# Patient Record
Sex: Female | Born: 1935 | Race: White | Hispanic: No | Smoking: Former smoker
Health system: Southern US, Community
[De-identification: ages and names within clinical notes are randomized; demographics above are authoritative.]

## PROBLEM LIST (undated history)

## (undated) DIAGNOSIS — R4182 Altered mental status, unspecified: Secondary | ICD-10-CM

## (undated) DIAGNOSIS — F329 Major depressive disorder, single episode, unspecified: Secondary | ICD-10-CM

## (undated) DIAGNOSIS — I509 Heart failure, unspecified: Secondary | ICD-10-CM

## (undated) DIAGNOSIS — I10 Essential (primary) hypertension: Secondary | ICD-10-CM

## (undated) DIAGNOSIS — I69921 Dysphasia following unspecified cerebrovascular disease: Secondary | ICD-10-CM

## (undated) DIAGNOSIS — F32A Depression, unspecified: Secondary | ICD-10-CM

## (undated) DIAGNOSIS — D649 Anemia, unspecified: Secondary | ICD-10-CM

## (undated) DIAGNOSIS — M1990 Unspecified osteoarthritis, unspecified site: Secondary | ICD-10-CM

## (undated) DIAGNOSIS — K219 Gastro-esophageal reflux disease without esophagitis: Secondary | ICD-10-CM

## (undated) DIAGNOSIS — I739 Peripheral vascular disease, unspecified: Secondary | ICD-10-CM

## (undated) HISTORY — DX: Anemia, unspecified: D64.9

---

## 2005-05-08 ENCOUNTER — Ambulatory Visit: Payer: Self-pay | Admitting: Internal Medicine

## 2006-01-04 ENCOUNTER — Ambulatory Visit (HOSPITAL_COMMUNITY): Admission: RE | Admit: 2006-01-04 | Discharge: 2006-01-04 | Payer: Self-pay | Admitting: Internal Medicine

## 2006-01-04 ENCOUNTER — Ambulatory Visit: Payer: Self-pay | Admitting: Internal Medicine

## 2006-01-05 ENCOUNTER — Inpatient Hospital Stay (HOSPITAL_COMMUNITY): Admission: EM | Admit: 2006-01-05 | Discharge: 2006-01-12 | Payer: Self-pay | Admitting: Emergency Medicine

## 2006-01-05 ENCOUNTER — Ambulatory Visit (HOSPITAL_COMMUNITY): Admission: RE | Admit: 2006-01-05 | Discharge: 2006-01-05 | Payer: Self-pay | Admitting: Internal Medicine

## 2009-08-27 ENCOUNTER — Inpatient Hospital Stay (HOSPITAL_COMMUNITY): Admission: EM | Admit: 2009-08-27 | Discharge: 2009-09-13 | Payer: Self-pay | Admitting: Emergency Medicine

## 2009-08-27 ENCOUNTER — Ambulatory Visit: Payer: Self-pay | Admitting: Pulmonary Disease

## 2009-09-07 ENCOUNTER — Encounter (INDEPENDENT_AMBULATORY_CARE_PROVIDER_SITE_OTHER): Payer: Self-pay | Admitting: Internal Medicine

## 2009-09-16 ENCOUNTER — Inpatient Hospital Stay (HOSPITAL_COMMUNITY): Admission: EM | Admit: 2009-09-16 | Discharge: 2009-09-18 | Payer: Self-pay | Admitting: Emergency Medicine

## 2010-01-10 ENCOUNTER — Inpatient Hospital Stay (HOSPITAL_COMMUNITY): Admission: EM | Admit: 2010-01-10 | Discharge: 2010-01-15 | Payer: Self-pay | Admitting: Emergency Medicine

## 2011-02-15 LAB — CBC
HCT: 32.9 % — ABNORMAL LOW (ref 36.0–46.0)
Hemoglobin: 10.6 g/dL — ABNORMAL LOW (ref 12.0–15.0)
Hemoglobin: 7.8 g/dL — ABNORMAL LOW (ref 12.0–15.0)
Hemoglobin: 9.4 g/dL — ABNORMAL LOW (ref 12.0–15.0)
Hemoglobin: 9.4 g/dL — ABNORMAL LOW (ref 12.0–15.0)
Hemoglobin: 9.9 g/dL — ABNORMAL LOW (ref 12.0–15.0)
MCHC: 33 g/dL (ref 30.0–36.0)
MCHC: 33.2 g/dL (ref 30.0–36.0)
MCHC: 33.5 g/dL (ref 30.0–36.0)
MCHC: 34.2 g/dL (ref 30.0–36.0)
MCV: 90.1 fL (ref 78.0–100.0)
MCV: 91 fL (ref 78.0–100.0)
MCV: 91.1 fL (ref 78.0–100.0)
MCV: 91.1 fL (ref 78.0–100.0)
MCV: 91.2 fL (ref 78.0–100.0)
Platelets: 214 10*3/uL (ref 150–400)
Platelets: 239 10*3/uL (ref 150–400)
Platelets: 275 10*3/uL (ref 150–400)
Platelets: 296 10*3/uL (ref 150–400)
RBC: 2.56 MIL/uL — ABNORMAL LOW (ref 3.87–5.11)
RBC: 3.11 MIL/uL — ABNORMAL LOW (ref 3.87–5.11)
RBC: 3.15 MIL/uL — ABNORMAL LOW (ref 3.87–5.11)
RBC: 3.23 MIL/uL — ABNORMAL LOW (ref 3.87–5.11)
RBC: 3.3 MIL/uL — ABNORMAL LOW (ref 3.87–5.11)
RDW: 15.1 % (ref 11.5–15.5)
RDW: 15.4 % (ref 11.5–15.5)
RDW: 15.5 % (ref 11.5–15.5)
RDW: 15.9 % — ABNORMAL HIGH (ref 11.5–15.5)
WBC: 6 10*3/uL (ref 4.0–10.5)
WBC: 7.6 10*3/uL (ref 4.0–10.5)
WBC: 7.8 10*3/uL (ref 4.0–10.5)

## 2011-02-15 LAB — DIFFERENTIAL
Basophils Absolute: 0.1 10*3/uL (ref 0.0–0.1)
Basophils Relative: 1 % (ref 0–1)
Eosinophils Absolute: 0.1 10*3/uL (ref 0.0–0.7)
Monocytes Absolute: 0.7 10*3/uL (ref 0.1–1.0)
Neutro Abs: 8.1 10*3/uL — ABNORMAL HIGH (ref 1.7–7.7)

## 2011-02-15 LAB — TYPE AND SCREEN
ABO/RH(D): A POS
DAT, IgG: NEGATIVE
Donor AG Type: NEGATIVE
Donor AG Type: NEGATIVE
Donor AG Type: NEGATIVE

## 2011-02-15 LAB — BASIC METABOLIC PANEL
CO2: 29 mEq/L (ref 19–32)
CO2: 34 mEq/L — ABNORMAL HIGH (ref 19–32)
Calcium: 8.4 mg/dL (ref 8.4–10.5)
Calcium: 8.4 mg/dL (ref 8.4–10.5)
Calcium: 8.7 mg/dL (ref 8.4–10.5)
Calcium: 9 mg/dL (ref 8.4–10.5)
Chloride: 101 mEq/L (ref 96–112)
Chloride: 103 mEq/L (ref 96–112)
Chloride: 98 mEq/L (ref 96–112)
Creatinine, Ser: 0.65 mg/dL (ref 0.4–1.2)
Creatinine, Ser: 0.87 mg/dL (ref 0.4–1.2)
GFR calc Af Amer: >60 mL/min (ref >60)
GFR calc Af Amer: >60 mL/min (ref >60)
GFR calc Af Amer: >60 mL/min (ref >60)
GFR calc non Af Amer: >60 mL/min (ref >60)
GFR calc non Af Amer: >60 mL/min (ref >60)
Glucose, Bld: 118 mg/dL — ABNORMAL HIGH (ref 70–99)
Glucose, Bld: 67 mg/dL — ABNORMAL LOW (ref 70–99)
Glucose, Bld: 72 mg/dL (ref 70–99)
Potassium: 3.8 mEq/L (ref 3.5–5.1)
Sodium: 140 mEq/L (ref 135–145)
Sodium: 141 mEq/L (ref 135–145)
Sodium: 142 mEq/L (ref 135–145)
Sodium: 142 mEq/L (ref 135–145)
Sodium: 142 mEq/L (ref 135–145)

## 2011-02-15 LAB — COMPREHENSIVE METABOLIC PANEL
ALT: 17 U/L (ref 0–35)
CO2: 33 mEq/L — ABNORMAL HIGH (ref 19–32)
Chloride: 98 mEq/L (ref 96–112)
GFR calc Af Amer: >60 mL/min (ref >60)
GFR calc non Af Amer: >60 mL/min (ref >60)
Glucose, Bld: 129 mg/dL — ABNORMAL HIGH (ref 70–99)
Potassium: 3.9 mEq/L (ref 3.5–5.1)
Total Protein: 5.8 g/dL — ABNORMAL LOW (ref 6.0–8.3)

## 2011-02-15 LAB — BLOOD GAS, ARTERIAL
Bicarbonate: 25.6 mEq/L — ABNORMAL HIGH (ref 20.0–24.0)
Drawn by: 10370
O2 Content: 3 L/min
O2 Saturation: 92.3 %
pCO2 arterial: 43.6 mmHg (ref 35.0–45.0)
pH, Arterial: 7.386 (ref 7.350–7.400)
pO2, Arterial: 62.3 mmHg — ABNORMAL LOW (ref 80.0–100.0)

## 2011-02-15 LAB — TSH: TSH: 2.234 u[IU]/mL (ref 0.350–4.500)

## 2011-03-02 LAB — BLOOD GAS, ARTERIAL
Bicarbonate: 21.1 mEq/L (ref 20.0–24.0)
Patient temperature: 98.2
TCO2: 20 mmol/L (ref 0–100)
pCO2 arterial: 48.5 mmHg — ABNORMAL HIGH (ref 35.0–45.0)
pH, Arterial: 7.26 — ABNORMAL LOW (ref 7.350–7.400)

## 2011-03-02 LAB — URINE MICROSCOPIC-ADD ON

## 2011-03-02 LAB — BASIC METABOLIC PANEL
BUN: 4 mg/dL — ABNORMAL LOW (ref 6–23)
BUN: 10 mg/dL (ref 6–23)
BUN: 12 mg/dL (ref 6–23)
BUN: 25 mg/dL — ABNORMAL HIGH (ref 6–23)
BUN: 26 mg/dL — ABNORMAL HIGH (ref 6–23)
BUN: 31 mg/dL — ABNORMAL HIGH (ref 6–23)
BUN: 7 mg/dL (ref 6–23)
CO2: 33 mEq/L — ABNORMAL HIGH (ref 19–32)
CO2: 26 mEq/L (ref 19–32)
CO2: 26 mEq/L (ref 19–32)
CO2: 35 mEq/L — ABNORMAL HIGH (ref 19–32)
Calcium: 8.1 mg/dL — ABNORMAL LOW (ref 8.4–10.5)
Calcium: 8.5 mg/dL (ref 8.4–10.5)
Calcium: 8.2 mg/dL — ABNORMAL LOW (ref 8.4–10.5)
Calcium: 7.3 mg/dL — ABNORMAL LOW (ref 8.4–10.5)
Calcium: 8 mg/dL — ABNORMAL LOW (ref 8.4–10.5)
Calcium: 8.1 mg/dL — ABNORMAL LOW (ref 8.4–10.5)
Calcium: 8.3 mg/dL — ABNORMAL LOW (ref 8.4–10.5)
Calcium: 8.4 mg/dL (ref 8.4–10.5)
Chloride: 97 mEq/L (ref 96–112)
Chloride: 114 mEq/L — ABNORMAL HIGH (ref 96–112)
Chloride: 97 mEq/L (ref 96–112)
Creatinine, Ser: 0.97 mg/dL (ref 0.4–1.2)
Creatinine, Ser: 0.65 mg/dL (ref 0.4–1.2)
Creatinine, Ser: 0.75 mg/dL (ref 0.4–1.2)
Creatinine, Ser: 0.92 mg/dL (ref 0.4–1.2)
Creatinine, Ser: 1.04 mg/dL (ref 0.4–1.2)
GFR calc Af Amer: >60 mL/min (ref >60)
GFR calc Af Amer: >60 mL/min (ref >60)
GFR calc Af Amer: 38 mL/min — ABNORMAL LOW (ref >60)
GFR calc Af Amer: >60 mL/min (ref >60)
GFR calc Af Amer: >60 mL/min (ref >60)
GFR calc Af Amer: >60 mL/min (ref >60)
GFR calc Af Amer: >60 mL/min (ref >60)
GFR calc non Af Amer: >60 mL/min (ref >60)
GFR calc non Af Amer: >60 mL/min (ref >60)
GFR calc non Af Amer: 39 mL/min — ABNORMAL LOW (ref >60)
GFR calc non Af Amer: 40 mL/min — ABNORMAL LOW (ref >60)
GFR calc non Af Amer: 31 mL/min — ABNORMAL LOW (ref >60)
GFR calc non Af Amer: 32 mL/min — ABNORMAL LOW (ref >60)
GFR calc non Af Amer: 39 mL/min — ABNORMAL LOW (ref >60)
GFR calc non Af Amer: 51 mL/min — ABNORMAL LOW (ref >60)
GFR calc non Af Amer: 59 mL/min — ABNORMAL LOW (ref >60)
GFR calc non Af Amer: >60 mL/min (ref >60)
GFR calc non Af Amer: >60 mL/min (ref >60)
GFR calc non Af Amer: >60 mL/min (ref >60)
Glucose, Bld: 97 mg/dL (ref 70–99)
Glucose, Bld: 146 mg/dL — ABNORMAL HIGH (ref 70–99)
Glucose, Bld: 157 mg/dL — ABNORMAL HIGH (ref 70–99)
Glucose, Bld: 171 mg/dL — ABNORMAL HIGH (ref 70–99)
Potassium: 3.1 mEq/L — ABNORMAL LOW (ref 3.5–5.1)
Potassium: 3.2 mEq/L — ABNORMAL LOW (ref 3.5–5.1)
Potassium: 4.1 mEq/L (ref 3.5–5.1)
Potassium: 2.5 mEq/L — CL (ref 3.5–5.1)
Potassium: 3.1 mEq/L — ABNORMAL LOW (ref 3.5–5.1)
Potassium: 3.4 mEq/L — ABNORMAL LOW (ref 3.5–5.1)
Potassium: 4 mEq/L (ref 3.5–5.1)
Potassium: 4.3 mEq/L (ref 3.5–5.1)
Sodium: 141 mEq/L (ref 135–145)
Sodium: 141 mEq/L (ref 135–145)
Sodium: 140 mEq/L (ref 135–145)
Sodium: 141 mEq/L (ref 135–145)
Sodium: 140 mEq/L (ref 135–145)
Sodium: 143 mEq/L (ref 135–145)
Sodium: 144 mEq/L (ref 135–145)
Sodium: 144 mEq/L (ref 135–145)
Sodium: 145 mEq/L (ref 135–145)
Sodium: 146 mEq/L — ABNORMAL HIGH (ref 135–145)

## 2011-03-02 LAB — CARDIAC PANEL(CRET KIN+CKTOT+MB+TROPI)
CK, MB: 2.6 ng/mL (ref 0.3–4.0)
CK, MB: 3.8 ng/mL (ref 0.3–4.0)
CK, MB: 4.3 ng/mL — ABNORMAL HIGH (ref 0.3–4.0)
CK, MB: 5.5 ng/mL — ABNORMAL HIGH (ref 0.3–4.0)
Relative Index: INVALID (ref 0.0–2.5)
Relative Index: INVALID (ref 0.0–2.5)
Total CK: 21 U/L (ref 7–177)
Total CK: 426 U/L — ABNORMAL HIGH (ref 7–177)
Troponin I: 0.08 ng/mL — ABNORMAL HIGH (ref 0.00–0.06)
Troponin I: 0.08 ng/mL — ABNORMAL HIGH (ref 0.00–0.06)
Troponin I: 0.08 ng/mL — ABNORMAL HIGH (ref 0.00–0.06)

## 2011-03-02 LAB — CBC
HCT: 31.6 % — ABNORMAL LOW (ref 36.0–46.0)
HCT: 33 % — ABNORMAL LOW (ref 36.0–46.0)
HCT: 34.4 % — ABNORMAL LOW (ref 36.0–46.0)
HCT: 29.2 % — ABNORMAL LOW (ref 36.0–46.0)
HCT: 30.2 % — ABNORMAL LOW (ref 36.0–46.0)
HCT: 30.3 % — ABNORMAL LOW (ref 36.0–46.0)
HCT: 30.6 % — ABNORMAL LOW (ref 36.0–46.0)
HCT: 31.1 % — ABNORMAL LOW (ref 36.0–46.0)
HCT: 31.3 % — ABNORMAL LOW (ref 36.0–46.0)
HCT: 31.4 % — ABNORMAL LOW (ref 36.0–46.0)
HCT: 32.2 % — ABNORMAL LOW (ref 36.0–46.0)
HCT: 35.5 % — ABNORMAL LOW (ref 36.0–46.0)
HCT: 36.3 % (ref 36.0–46.0)
HCT: 37.9 % (ref 36.0–46.0)
Hemoglobin: 9.8 g/dL — ABNORMAL LOW (ref 12.0–15.0)
Hemoglobin: 11.2 g/dL — ABNORMAL LOW (ref 12.0–15.0)
Hemoglobin: 10 g/dL — ABNORMAL LOW (ref 12.0–15.0)
Hemoglobin: 10 g/dL — ABNORMAL LOW (ref 12.0–15.0)
Hemoglobin: 11.4 g/dL — ABNORMAL LOW (ref 12.0–15.0)
Hemoglobin: 11.6 g/dL — ABNORMAL LOW (ref 12.0–15.0)
Hemoglobin: 11.9 g/dL — ABNORMAL LOW (ref 12.0–15.0)
Hemoglobin: 12.4 g/dL (ref 12.0–15.0)
Hemoglobin: 9.4 g/dL — ABNORMAL LOW (ref 12.0–15.0)
Hemoglobin: 9.9 g/dL — ABNORMAL LOW (ref 12.0–15.0)
Hemoglobin: 9.9 g/dL — ABNORMAL LOW (ref 12.0–15.0)
MCHC: 32.5 g/dL (ref 30.0–36.0)
MCHC: 31.7 g/dL (ref 30.0–36.0)
MCHC: 31.8 g/dL (ref 30.0–36.0)
MCHC: 32.3 g/dL (ref 30.0–36.0)
MCHC: 32.5 g/dL (ref 30.0–36.0)
MCHC: 32.6 g/dL (ref 30.0–36.0)
MCHC: 32.7 g/dL (ref 30.0–36.0)
MCHC: 32.8 g/dL (ref 30.0–36.0)
MCHC: 33 g/dL (ref 30.0–36.0)
MCHC: 33.2 g/dL (ref 30.0–36.0)
MCV: 89.8 fL (ref 78.0–100.0)
MCV: 89.9 fL (ref 78.0–100.0)
MCV: 90.5 fL (ref 78.0–100.0)
MCV: 90.9 fL (ref 78.0–100.0)
MCV: 90.9 fL (ref 78.0–100.0)
MCV: 91.1 fL (ref 78.0–100.0)
MCV: 91.4 fL (ref 78.0–100.0)
MCV: 91.7 fL (ref 78.0–100.0)
MCV: 92.5 fL (ref 78.0–100.0)
Platelets: 177 10*3/uL (ref 150–400)
Platelets: 247 10*3/uL (ref 150–400)
Platelets: 321 10*3/uL (ref 150–400)
Platelets: 331 10*3/uL (ref 150–400)
Platelets: 117 K/uL — ABNORMAL LOW (ref 150–400)
Platelets: 125 10*3/uL — ABNORMAL LOW (ref 150–400)
Platelets: 134 K/uL — ABNORMAL LOW (ref 150–400)
Platelets: 163 10*3/uL (ref 150–400)
Platelets: 178 10*3/uL (ref 150–400)
Platelets: 192 K/uL (ref 150–400)
Platelets: 247 10*3/uL (ref 150–400)
Platelets: 290 10*3/uL (ref 150–400)
Platelets: 320 10*3/uL (ref 150–400)
Platelets: 324 10*3/uL (ref 150–400)
RBC: 3.36 MIL/uL — ABNORMAL LOW (ref 3.87–5.11)
RBC: 3.58 MIL/uL — ABNORMAL LOW (ref 3.87–5.11)
RBC: 3.82 MIL/uL — ABNORMAL LOW (ref 3.87–5.11)
RBC: 3.3 MIL/uL — ABNORMAL LOW (ref 3.87–5.11)
RBC: 3.34 MIL/uL — ABNORMAL LOW (ref 3.87–5.11)
RBC: 3.34 MIL/uL — ABNORMAL LOW (ref 3.87–5.11)
RBC: 3.4 MIL/uL — ABNORMAL LOW (ref 3.87–5.11)
RBC: 3.76 MIL/uL — ABNORMAL LOW (ref 3.87–5.11)
RBC: 3.97 MIL/uL (ref 3.87–5.11)
RBC: 4.02 MIL/uL (ref 3.87–5.11)
RBC: 4.16 MIL/uL (ref 3.87–5.11)
RDW: 14.6 % (ref 11.5–15.5)
RDW: 15.2 % (ref 11.5–15.5)
RDW: 14.2 % (ref 11.5–15.5)
RDW: 14.7 % (ref 11.5–15.5)
RDW: 15 % (ref 11.5–15.5)
RDW: 15.2 % (ref 11.5–15.5)
RDW: 15.4 % (ref 11.5–15.5)
RDW: 15.5 % (ref 11.5–15.5)
RDW: 15.6 % — ABNORMAL HIGH (ref 11.5–15.5)
RDW: 15.7 % — ABNORMAL HIGH (ref 11.5–15.5)
RDW: 15.8 % — ABNORMAL HIGH (ref 11.5–15.5)
RDW: 16.4 % — ABNORMAL HIGH (ref 11.5–15.5)
WBC: 5.2 10*3/uL (ref 4.0–10.5)
WBC: 7.6 10*3/uL (ref 4.0–10.5)
WBC: 8.3 10*3/uL (ref 4.0–10.5)
WBC: 10.2 10*3/uL (ref 4.0–10.5)
WBC: 10.8 10*3/uL — ABNORMAL HIGH (ref 4.0–10.5)
WBC: 10.8 K/uL — ABNORMAL HIGH (ref 4.0–10.5)
WBC: 12.8 10*3/uL — ABNORMAL HIGH (ref 4.0–10.5)
WBC: 13.3 10*3/uL — ABNORMAL HIGH (ref 4.0–10.5)
WBC: 13.6 K/uL — ABNORMAL HIGH (ref 4.0–10.5)
WBC: 14.7 K/uL — ABNORMAL HIGH (ref 4.0–10.5)
WBC: 16.5 10*3/uL — ABNORMAL HIGH (ref 4.0–10.5)
WBC: 18.9 10*3/uL — ABNORMAL HIGH (ref 4.0–10.5)

## 2011-03-02 LAB — BASIC METABOLIC PANEL WITH GFR
BUN: 37 mg/dL — ABNORMAL HIGH (ref 6–23)
BUN: 41 mg/dL — ABNORMAL HIGH (ref 6–23)
CO2: 23 meq/L (ref 19–32)
CO2: 24 meq/L (ref 19–32)
Calcium: 6.9 mg/dL — ABNORMAL LOW (ref 8.4–10.5)
Calcium: 7.2 mg/dL — ABNORMAL LOW (ref 8.4–10.5)
Chloride: 102 meq/L (ref 96–112)
Chloride: 109 meq/L (ref 96–112)
Creatinine, Ser: 1.41 mg/dL — ABNORMAL HIGH (ref 0.4–1.2)
Creatinine, Ser: 1.67 mg/dL — ABNORMAL HIGH (ref 0.4–1.2)
GFR calc non Af Amer: 29 mL/min — ABNORMAL LOW
GFR calc non Af Amer: 36 mL/min — ABNORMAL LOW
Glucose, Bld: 128 mg/dL — ABNORMAL HIGH (ref 70–99)
Glucose, Bld: 143 mg/dL — ABNORMAL HIGH (ref 70–99)
Potassium: 4.4 meq/L (ref 3.5–5.1)
Potassium: 4.5 meq/L (ref 3.5–5.1)
Sodium: 132 meq/L — ABNORMAL LOW (ref 135–145)
Sodium: 136 meq/L (ref 135–145)

## 2011-03-02 LAB — COMPREHENSIVE METABOLIC PANEL
ALT: 12 U/L (ref 0–35)
ALT: 16 U/L (ref 0–35)
AST: 21 U/L (ref 0–37)
AST: 11 U/L (ref 0–37)
AST: 23 U/L (ref 0–37)
Albumin: 2.6 g/dL — ABNORMAL LOW (ref 3.5–5.2)
Albumin: 2.4 g/dL — ABNORMAL LOW (ref 3.5–5.2)
Alkaline Phosphatase: 55 U/L (ref 39–117)
Alkaline Phosphatase: 63 U/L (ref 39–117)
CO2: 31 mEq/L (ref 19–32)
Calcium: 8.3 mg/dL — ABNORMAL LOW (ref 8.4–10.5)
Calcium: 8.8 mg/dL (ref 8.4–10.5)
Chloride: 101 mEq/L (ref 96–112)
Chloride: 117 mEq/L — ABNORMAL HIGH (ref 96–112)
Creatinine, Ser: 0.79 mg/dL (ref 0.4–1.2)
Creatinine, Ser: 1.45 mg/dL — ABNORMAL HIGH (ref 0.4–1.2)
GFR calc Af Amer: >60 mL/min (ref >60)
GFR calc Af Amer: 42 mL/min — ABNORMAL LOW (ref >60)
GFR calc Af Amer: >60 mL/min (ref >60)
GFR calc non Af Amer: >60 mL/min (ref >60)
Glucose, Bld: 117 mg/dL — ABNORMAL HIGH (ref 70–99)
Potassium: 3.2 mEq/L — ABNORMAL LOW (ref 3.5–5.1)
Potassium: 4.1 mEq/L (ref 3.5–5.1)
Sodium: 140 mEq/L (ref 135–145)
Sodium: 145 mEq/L (ref 135–145)
Total Bilirubin: 0.7 mg/dL (ref 0.3–1.2)
Total Bilirubin: 1.3 mg/dL — ABNORMAL HIGH (ref 0.3–1.2)
Total Protein: 5 g/dL — ABNORMAL LOW (ref 6.0–8.3)
Total Protein: 5.3 g/dL — ABNORMAL LOW (ref 6.0–8.3)

## 2011-03-02 LAB — DIFFERENTIAL
Basophils Absolute: 0.1 10*3/uL (ref 0.0–0.1)
Basophils Absolute: 0.1 K/uL (ref 0.0–0.1)
Basophils Relative: 0 % (ref 0–1)
Basophils Relative: 0 % (ref 0–1)
Basophils Relative: 1 % (ref 0–1)
Basophils Relative: 1 % (ref 0–1)
Eosinophils Absolute: 0.2 10*3/uL (ref 0.0–0.7)
Eosinophils Absolute: 0 K/uL (ref 0.0–0.7)
Eosinophils Absolute: 0.1 10*3/uL (ref 0.0–0.7)
Eosinophils Absolute: 0.1 10*3/uL (ref 0.0–0.7)
Eosinophils Relative: 2 % (ref 0–5)
Eosinophils Relative: 3 % (ref 0–5)
Eosinophils Relative: 1 % (ref 0–5)
Eosinophils Relative: 1 % (ref 0–5)
Eosinophils Relative: 0 % (ref 0–5)
Eosinophils Relative: 0 % (ref 0–5)
Eosinophils Relative: 1 % (ref 0–5)
Eosinophils Relative: 1 % (ref 0–5)
Lymphocytes Relative: 12 % (ref 12–46)
Lymphocytes Relative: 17 % (ref 12–46)
Lymphocytes Relative: 10 % — ABNORMAL LOW (ref 12–46)
Lymphocytes Relative: 10 % — ABNORMAL LOW (ref 12–46)
Lymphocytes Relative: 7 % — ABNORMAL LOW (ref 12–46)
Lymphocytes Relative: 4 % — ABNORMAL LOW (ref 12–46)
Lymphocytes Relative: 7 % — ABNORMAL LOW (ref 12–46)
Lymphs Abs: 0.6 10*3/uL — ABNORMAL LOW (ref 0.7–4.0)
Lymphs Abs: 1.4 10*3/uL (ref 0.7–4.0)
Lymphs Abs: 1 10*3/uL (ref 0.7–4.0)
Lymphs Abs: 0.5 K/uL — ABNORMAL LOW (ref 0.7–4.0)
Lymphs Abs: 0.6 10*3/uL — ABNORMAL LOW (ref 0.7–4.0)
Lymphs Abs: 0.8 10*3/uL (ref 0.7–4.0)
Monocytes Absolute: 0.9 10*3/uL (ref 0.1–1.0)
Monocytes Absolute: 1.2 10*3/uL — ABNORMAL HIGH (ref 0.1–1.0)
Monocytes Absolute: 0.5 K/uL (ref 0.1–1.0)
Monocytes Absolute: 0.7 10*3/uL (ref 0.1–1.0)
Monocytes Absolute: 1 10*3/uL (ref 0.1–1.0)
Monocytes Relative: 11 % (ref 3–12)
Monocytes Relative: 4 % (ref 3–12)
Monocytes Relative: 9 % (ref 3–12)
Neutro Abs: 3.9 10*3/uL (ref 1.7–7.7)
Neutro Abs: 7.9 10*3/uL — ABNORMAL HIGH (ref 1.7–7.7)
Neutro Abs: 8.6 10*3/uL — ABNORMAL HIGH (ref 1.7–7.7)
Neutro Abs: 9.4 10*3/uL — ABNORMAL HIGH (ref 1.7–7.7)
Neutro Abs: 13.5 K/uL — ABNORMAL HIGH (ref 1.7–7.7)
Neutrophils Relative %: 80 % — ABNORMAL HIGH (ref 43–77)
Neutrophils Relative %: 92 % — ABNORMAL HIGH (ref 43–77)

## 2011-03-02 LAB — URINE CULTURE
Colony Count: NO GROWTH
Colony Count: >=100000
Culture: NO GROWTH
Culture: NO GROWTH

## 2011-03-02 LAB — T4: T4, Total: 4.8 ug/dL — ABNORMAL LOW (ref 5.0–12.5)

## 2011-03-02 LAB — TSH: TSH: 4.096 u[IU]/mL (ref 0.350–4.500)

## 2011-03-02 LAB — COMPREHENSIVE METABOLIC PANEL WITH GFR
ALT: 24 U/L (ref 0–35)
AST: 37 U/L (ref 0–37)
Albumin: 2.9 g/dL — ABNORMAL LOW (ref 3.5–5.2)
Alkaline Phosphatase: 89 U/L (ref 39–117)
BUN: 32 mg/dL — ABNORMAL HIGH (ref 6–23)
CO2: 31 meq/L (ref 19–32)
Calcium: 8.8 mg/dL (ref 8.4–10.5)
Chloride: 97 meq/L (ref 96–112)
Creatinine, Ser: 1.31 mg/dL — ABNORMAL HIGH (ref 0.4–1.2)
GFR calc non Af Amer: 39 mL/min — ABNORMAL LOW
Glucose, Bld: 113 mg/dL — ABNORMAL HIGH (ref 70–99)
Potassium: 3.7 meq/L (ref 3.5–5.1)
Sodium: 138 meq/L (ref 135–145)
Total Bilirubin: 0.8 mg/dL (ref 0.3–1.2)
Total Protein: 6.4 g/dL (ref 6.0–8.3)

## 2011-03-02 LAB — CULTURE, BLOOD (ROUTINE X 2)
Culture: NO GROWTH
Culture: NO GROWTH

## 2011-03-02 LAB — URINALYSIS, ROUTINE W REFLEX MICROSCOPIC
Glucose, UA: NEGATIVE mg/dL
Glucose, UA: NEGATIVE mg/dL
Hgb urine dipstick: NEGATIVE
Ketones, ur: NEGATIVE mg/dL
Ketones, ur: NEGATIVE mg/dL
Nitrite: NEGATIVE
Nitrite: NEGATIVE
Nitrite: NEGATIVE
Nitrite: NEGATIVE
Protein, ur: NEGATIVE mg/dL
Protein, ur: 30 mg/dL — AB
Specific Gravity, Urine: 1.028 (ref 1.005–1.030)
Specific Gravity, Urine: 1.009 (ref 1.005–1.030)
Specific Gravity, Urine: 1.024 (ref 1.005–1.030)
Urobilinogen, UA: 0.2 mg/dL (ref 0.0–1.0)
Urobilinogen, UA: 0.2 mg/dL (ref 0.0–1.0)
Urobilinogen, UA: 0.2 mg/dL (ref 0.0–1.0)
Urobilinogen, UA: 0.2 mg/dL (ref 0.0–1.0)
pH: 6 (ref 5.0–8.0)
pH: 5 (ref 5.0–8.0)
pH: 5 (ref 5.0–8.0)

## 2011-03-02 LAB — POCT CARDIAC MARKERS
Troponin i, poc: <0.05 ng/mL (ref 0.00–0.09)
Troponin i, poc: 0.09 ng/mL (ref 0.00–0.09)

## 2011-03-02 LAB — CK TOTAL AND CKMB (NOT AT ARMC)
CK, MB: 3.4 ng/mL (ref 0.3–4.0)
Relative Index: INVALID (ref 0.0–2.5)
Relative Index: 2.7 — ABNORMAL HIGH (ref 0.0–2.5)
Total CK: 17 U/L (ref 7–177)
Total CK: 126 U/L (ref 7–177)

## 2011-03-02 LAB — TYPE AND SCREEN
ABO/RH(D): A POS
Antibody Screen: POSITIVE
PT AG Type: NEGATIVE

## 2011-03-02 LAB — BRAIN NATRIURETIC PEPTIDE
Pro B Natriuretic peptide (BNP): 2380 pg/mL — ABNORMAL HIGH (ref 0.0–100.0)
Pro B Natriuretic peptide (BNP): 298 pg/mL — ABNORMAL HIGH (ref 0.0–100.0)
Pro B Natriuretic peptide (BNP): 2270 pg/mL — ABNORMAL HIGH (ref 0.0–100.0)
Pro B Natriuretic peptide (BNP): 476 pg/mL — ABNORMAL HIGH (ref 0.0–100.0)

## 2011-03-02 LAB — TROPONIN I

## 2011-03-02 LAB — POCT I-STAT 3, ART BLOOD GAS (G3+)
Bicarbonate: 33.7 mEq/L — ABNORMAL HIGH (ref 20.0–24.0)
TCO2: 35 mmol/L (ref 0–100)
pH, Arterial: 7.369 (ref 7.350–7.400)
pO2, Arterial: 108 mmHg — ABNORMAL HIGH (ref 80.0–100.0)

## 2011-03-02 LAB — HEMOGLOBIN A1C: Mean Plasma Glucose: 117 mg/dL

## 2011-03-02 LAB — PHOSPHORUS: Phosphorus: 2.2 mg/dL — ABNORMAL LOW (ref 2.3–4.6)

## 2011-03-02 LAB — T4, FREE: Free T4: 1.16 ng/dL (ref 0.80–1.80)

## 2011-03-02 LAB — T3 UPTAKE: T3 Uptake Ratio: 42.2 % — ABNORMAL HIGH (ref 22.5–37.0)

## 2011-04-14 NOTE — Discharge Summary (Signed)
NAMECORNELLA, EMMER                ACCOUNT NO.:  [id number]   MEDICAL RECORD NO.:  [id number]          PATIENT TYPE:  INP   LOCATION:  5504                         FACILITY:  [facility]   PHYSICIAN:  Wilmon Arms. Corliss Skains, M.D. DATE OF BIRTH:  [birth year]   DATE OF ADMISSION:  01/05/2006  DATE OF DISCHARGE:                                 DISCHARGE SUMMARY   ADMITTING PHYSICIAN:  Dr. Lindie Spruce   GASTROENTEROLOGIST:  Dr. Leone Payor with [facility].   CHIEF COMPLAINT AND REASON FOR ADMISSION:  Ms. Elsbernd is a 77 year old female  patient previously from [place], has been in [place] for several years.  She has a documented history of significant diverticular disease and  recurrent small-bowel obstruction. She was seen by gastroenterology earlier  in the week due to issues related to nausea, vomiting, abdominal pain, and  diarrhea. These have been problems for years. Dr. Leone Payor ordered a CT scan  of the abdomen for completeness of evaluation and this demonstrated a  significant small-bowel obstruction with an ileocecal intussusception. In  discussing with the patient any other symptoms, she tells me that she has to  force any defecation and at times after eating she has postprandial  diarrhea. While the patient was in the ER she was having active nausea and  vomiting but was refusing an NG tube despite multiple attempts and  discussions with the patient about her condition. She apparently had no  insight into the seriousness of her condition and at time of evaluation in  the ER was refusing any surgical intervention as well. In the ER she was  afebrile and her vital signs were stable. Her abdomen was quite distended  and tympanitic. She had roaring rushing bowel sounds in the upper abdomen,  none to diminished in the lower abdomen. Amylase was 36 and lipase 13,  potassium 4.3, BUN 13, creatinine 0.5, glucose 96. White count 10,900;  neutrophils 74%; hemoglobin 13.6, platelets 416,000.   The patient was  admitted with the following diagnoses:  1.  Small-bowel obstruction secondary to ileocecal intussusception.  2.  Myelosuppression, on B12 repletion.  3.  Known small-bowel diverticula with recurrent small-bowel obstructions.  4.  Abdominal pain and distention due to small-bowel obstruction.  5.  Osteoarthritis.  6.  Questionable dementia.   HOSPITAL COURSE:  The patient was admitted to the general floor. Eventually,  she consented to an NG tube. This was placed without difficulty. Her abdomen  was decompressed successfully with the NG tube. She did have a tiny amount  of stool within the first 24 hours. She was eating a large amount of ice  chips. The patient underwent a decompressive barium enema x-ray in radiology  on January 07, 2006. Dr. Ezzard Standing did discuss with the radiologist and  initially it was felt that the decompression procedure was successful, but  the x-ray was later reviewed and the radiologist doubted that he was able to  reduce the intussusception. Physicians remained in contact with the  patient's sister, who was the only living family member. The patient  continued to defer surgery despite urgings from physicians and her  sister.  By January 09, 2006, the patient's x-ray showed gas and a nondilated large  and small bowel and no obstruction or free air with a mild ileus. It was  decided at that point that we would attempt clear liquids on the patient to  see how she tolerated. Her white count had previously decreased to 5400. She  had been empirically placed on Unasyn at admission.   The next few days, the patient continued to tolerate a clear liquid diet,  then a full liquid diet, with subtle increases in abdominal distention. By  January 11, 2006, the patient was started on a low residue chopped meats  diet. She was continuing to have at least one bowel movement a day. MiraLax  had been added to the regimen as well. By date of discharge her abdomen was  distended  but not as distended as before admission. She had bowel sounds  present, she was nontender. She was continuing to have at least one bowel  movement a day. She was not having any nausea or vomiting. Consideration is  for discharge today if remains without nausea and vomiting or abdominal pain  later this afternoon.   Given the fact that the patient seemed to have lack of insight into the  seriousness of her condition, a psychiatry consult was obtained on January 10, 2006. Dr. Jeanie Sewer saw the patient. In his note he reports that the  patient has a longstanding history of anxiety causing ambivalence,  procrastination in decisions, as well as painful situations and fear  increasing this experience. This keeps the patient from making a realistic  appraisal and a consistent choice in care. The patient does not have the  capacity for informed consent. Recommendations from psychiatry, in addition  to the above, were to start the patient on Lexapro. In review of the  patient's medications prior to admission which had been on hold due to the  small-bowel obstruction, she was on Lexapro 10 mg p.o. daily and this has  been restarted.   FINAL DISCHARGE DIAGNOSES:  1.  Small-bowel obstruction, resolved at present. The patient tolerating      diet and having bowel movements.  2.  Ileocecal intussusception, stable.  3.  Generalized anxiety disorder, deemed clinically incompetent for decision      making per Dr. Jeanie Sewer January 10, 2006.  4.  Multiple diverticula found on CT scan, several very large.   DISCHARGE MEDICATIONS:  1.  MiraLax 17 g in 8 ounces of juice or water at bedtime.  2.  Docusate sodium 300 mg daily.  3.  Stop Lasix for now.  4.  Prilosec 20 mg daily.  5.  Stop Neurontin for now.  6.  Do not use Imodium or any other antidiarrheals in this patient since she      has a history of ileocecal intussusception. 7.  Stop K-Dur since Lasix is discontinued.  8.  Hold atenolol for  now; systolic blood pressure is low.  9.  B12 injections 1000 mcg weekly as before.  10. Lexapro 10 mg daily.   DIET:  Low residue with chopped meats. Avoid seeds or other large, difficult-  to-digest-type food products which may cause acute bowel obstruction.   ACTIVITY:  As previous.   WOUND CARE:  Not applicable.   OTHER ISSUES:  I did speak at length with the patient's sister, Duwaine Maxin, today on the day of discharge, telephone number [phone]. Explained  to her the findings from the psychiatrist and  she is at this time in the  process of pursuing her goals from a legal standpoint to help make decisions  for her sister, given the fact that she has been deemed clinically  incompetent.   FOLLOW-UP:  1.  The patient will return to [facility] as previously, with      notations regarding medications and treatments as above.  2.  She is to follow up with Dr. Lindie Spruce p.r.n. if she opts to electively      proceed with surgery, or they can notify [facility] if the      patient comes in with an emergent bowel obstruction requiring emergent      surgery.      Allison L. Rondel Jumbo. Tsuei, M.D.  Electronically Signed    ALE/MEDQ  D:  01/12/2006  T:  01/12/2006  Job:  [id number]   cc:   Cherylynn Ridges, M.D.  [address]  [place]  [place] [address]   Iva Boop, M.D. [facility]  [address] [place], [place] [address]

## 2011-04-14 NOTE — H&P (Signed)
Carrie [name], [name]:  [id number]   MEDICAL RECORD NO.:  [id number]          PATIENT TYPE:  INP   LOCATION:  1824                         FACILITY:  [facility]   PHYSICIAN:  Cherylynn Ridges, M.D.    DATE OF BIRTH:  08-31-1925   DATE OF ADMISSION:  01/05/2006  DATE OF DISCHARGE:                                HISTORY & PHYSICAL   GASTROENTEROLOGIST:  Dr. Leone Payor with [facility].   SURGEON AND ADMITTING PHYSICIAN:  Dr. Lindie Spruce with [facility].   CHIEF COMPLAINT:  Small-bowel obstruction and abdominal pain.   HISTORY OF PRESENT ILLNESS:  Carrie [name] is a 77 year old female patient  recently moved from [place] in the past few years. She has a documented  history of diverticular disease of the small bowel, apparent recurrent small-  bowel obstructions, as well as myelodysplasia. She was self-referred to  gastroenterology due to nausea and vomiting, abdominal pain, and diarrhea.  In talking with the patient she states she has had these type of symptoms  for years. They appear to have gotten worse over the past few months with  increasing abdominal distention and discomfort. She apparently has been able  to eat most of the time without immediate emesis, although she does report  intermittent bouts of nausea and vomiting. She also relates issues related  to postprandial diarrhea. She was evaluated by Dr. Leone Payor with GI on  January 24, 2006. Had a chest x-ray done that showed small-bowel  obstruction. She was sent for CT of the abdomen that demonstrated  significant small-bowel obstruction with an ileocecal intussusception. In  discussing other symptomatology, the patient tells me that she has to force  any defecation. She also tells me that yesterday she ate pepper steak  without nausea and vomiting but did experience postprandial diarrhea.   REVIEW OF SYSTEMS:  As per the history of present illness. She denies any  fevers or chills, chest pain,  tachypalpitations, shortness of breath,  orthopnea. Denies blood in stools or urine. No lower extremity swelling.   PAST MEDICAL HISTORY:  1.  Depression.  2.  Myelodysplasia.  3.  B12 deficiency on monthly replete.  4.  Small-bowel diverticular disease.  5.  Osteoarthritis.   PAST SURGICAL HISTORY:  1.  Appendectomy at age 77.  2.  Vulvar cancer with partial radical vulvectomy.  3.  Prior left groin dissection.  4.  Tonsillectomy.   FAMILY MEDICAL HISTORY:  Noncontributory to this admission.   SOCIAL HISTORY:  She is a widow. She is a resident of the [facility] in [place]. She does not use tobacco or alcohol products.   ALLERGIES:  No known drug allergies.   CURRENT MEDICATIONS:  1.  Docusate sodium 300 mg daily.  2.  Lasix 20 mg daily.  3.  Prilosec 20 mg daily.  4.  Neurontin 300 mg t.i.d.  5.  K-Dur 20 mEq daily.  6.  Atenolol 25 mg daily.  7.  Reglan 5 mg a.c. and h.s.  8.  B12 injection 1000 mcg weekly.  9.  Lexapro 10 mg daily.  PHYSICAL EXAMINATION:  GENERAL:  Pleasant elderly female complaining of  significant abdominal bloating and discomfort. Verbalizes alternately that  she either wants to go home or she wants to have surgery to make her feel  better.  VITAL SIGNS:  Temperature 97.5, blood pressure 122/74, pulse 84,  respirations 16.  NEUROLOGIC:  She is alert, she wears glasses. She has obvious short-term  memory deficits. I have to repeat many of the same concepts I have spoken  with her earlier in the exam. She moving all extremities x4.  HEENT:  Head is normocephalic, sclerae not injected.  NECK:  Supple, no adenopathy.  CHEST:  Bilateral lung sounds are clear to auscultation. Respiratory effort  is nonlabored.  ABDOMEN:  Quite distended and tympanitic. She has roaring and rushing bowel  sounds in the upper abdomen, none to diminished bowel sounds in the lower  abdomen. Due to the size of the abdomen, unable to appreciate by percussion   any hepatosplenomegaly. No abdominal wall hernias are noted.  GENITOURINARY:  The patient wears a diaper and is incontinent of urine.  EXTREMITIES:  Thin without any edema. Pulses are palpable.   LABORATORY:  Lipase 13, amylase 36. Sodium 143, potassium 4.3, CO2 29, BUN  13, creatinine 0.5, glucose 96, LFTs are normal. White count is mildly  elevated at 10,900 with a normal neutrophil count of 74%. Hemoglobin 13.6.  Platelets are 416,000.   DIAGNOSTICS:  CT of the abdomen and pelvis shows a distal small-bowel  obstruction secondary to ileocecal intussusception, no free air or fluid.  Chest x-ray has already been done, shows no acute chest process with small-  bowel obstruction.   IMPRESSION:  1.  Small-bowel obstruction secondary to ileocecal intussusception.  2.  Myelosuppression with known B12 deficiency.  3.  Known small-bowel diverticula with apparent recurrent small-bowel      obstruction.  4.  Abdominal pain and distention secondary to small-bowel obstruction.  5.  Osteoarthritis.  6.  Suspected dementia.   PLAN:  1.  N.p.o. for now. IV fluid hydration. NG tube decompression. I suspect      that we will not be able to get the NG tube inserted in this lady until      she goes to surgery and anesthesia assists [place] with this.  2.  Probable operative intervention. Dr. Lindie Spruce needs to discuss with the      patient. Obviously the patient's condition warrants surgical      intervention, but due to the patient's mental status Dr. Lindie Spruce needs to      discuss with her first. Again, she verbalizes alternately that she      either wants to stay and get her problem fixed or she wants to go home.  3.  We will go ahead and start her on empiric Unasyn IV q.6h.  4.  Low-dose IV pain medications due to advanced age. Zofran for nausea due      to advanced age.  5.  Labs were drawn yesterday as noted. Will add a PT/PTT. She probably     needs an EKG as well preoperatively.      Allison L.  Rennis Harding, N.P.      Cherylynn Ridges, M.D.  Electronically Signed    ALE/MEDQ  D:  01/05/2006  T:  01/05/2006  Job:  [id number]   cc:   Iva Boop, M.D. [facility]  [address] [place], [place] [address]

## 2011-05-22 ENCOUNTER — Other Ambulatory Visit: Payer: Self-pay | Admitting: Internal Medicine

## 2011-05-22 DIAGNOSIS — Z1231 Encounter for screening mammogram for malignant neoplasm of breast: Secondary | ICD-10-CM

## 2011-06-08 ENCOUNTER — Ambulatory Visit (HOSPITAL_COMMUNITY): Payer: Self-pay

## 2011-07-06 ENCOUNTER — Ambulatory Visit (HOSPITAL_COMMUNITY): Payer: Self-pay

## 2013-02-02 ENCOUNTER — Encounter (HOSPITAL_COMMUNITY): Payer: Self-pay | Admitting: Emergency Medicine

## 2013-02-02 ENCOUNTER — Inpatient Hospital Stay (HOSPITAL_COMMUNITY)
Admission: EM | Admit: 2013-02-02 | Discharge: 2013-02-11 | DRG: 291 | Disposition: A | Discharge status: Hospice | Payer: Medicare HMO | Attending: Internal Medicine | Admitting: Internal Medicine

## 2013-02-02 ENCOUNTER — Emergency Department (HOSPITAL_COMMUNITY): Payer: Medicare HMO

## 2013-02-02 ENCOUNTER — Other Ambulatory Visit: Payer: Self-pay

## 2013-02-02 DIAGNOSIS — D689 Coagulation defect, unspecified: Secondary | ICD-10-CM | POA: Diagnosis present

## 2013-02-02 DIAGNOSIS — E875 Hyperkalemia: Secondary | ICD-10-CM | POA: Diagnosis present

## 2013-02-02 DIAGNOSIS — Z66 Do not resuscitate: Secondary | ICD-10-CM | POA: Diagnosis present

## 2013-02-02 DIAGNOSIS — F0390 Unspecified dementia without behavioral disturbance: Secondary | ICD-10-CM | POA: Diagnosis present

## 2013-02-02 DIAGNOSIS — K5909 Other constipation: Secondary | ICD-10-CM | POA: Diagnosis present

## 2013-02-02 DIAGNOSIS — D72829 Elevated white blood cell count, unspecified: Secondary | ICD-10-CM | POA: Diagnosis present

## 2013-02-02 DIAGNOSIS — K761 Chronic passive congestion of liver: Secondary | ICD-10-CM | POA: Diagnosis present

## 2013-02-02 DIAGNOSIS — I509 Heart failure, unspecified: Secondary | ICD-10-CM | POA: Diagnosis present

## 2013-02-02 DIAGNOSIS — I5031 Acute diastolic (congestive) heart failure: Secondary | ICD-10-CM

## 2013-02-02 DIAGNOSIS — D649 Anemia, unspecified: Secondary | ICD-10-CM

## 2013-02-02 DIAGNOSIS — R188 Other ascites: Secondary | ICD-10-CM | POA: Diagnosis present

## 2013-02-02 DIAGNOSIS — B961 Klebsiella pneumoniae [K. pneumoniae] as the cause of diseases classified elsewhere: Secondary | ICD-10-CM | POA: Diagnosis present

## 2013-02-02 DIAGNOSIS — R7402 Elevation of levels of lactic acid dehydrogenase (LDH): Secondary | ICD-10-CM | POA: Diagnosis present

## 2013-02-02 DIAGNOSIS — R52 Pain, unspecified: Secondary | ICD-10-CM

## 2013-02-02 DIAGNOSIS — R1314 Dysphagia, pharyngoesophageal phase: Secondary | ICD-10-CM

## 2013-02-02 DIAGNOSIS — N179 Acute kidney failure, unspecified: Secondary | ICD-10-CM | POA: Diagnosis present

## 2013-02-02 DIAGNOSIS — N390 Urinary tract infection, site not specified: Secondary | ICD-10-CM | POA: Diagnosis present

## 2013-02-02 DIAGNOSIS — R627 Adult failure to thrive: Secondary | ICD-10-CM | POA: Diagnosis present

## 2013-02-02 DIAGNOSIS — I5033 Acute on chronic diastolic (congestive) heart failure: Principal | ICD-10-CM | POA: Diagnosis present

## 2013-02-02 DIAGNOSIS — F411 Generalized anxiety disorder: Secondary | ICD-10-CM | POA: Diagnosis present

## 2013-02-02 DIAGNOSIS — K5900 Constipation, unspecified: Secondary | ICD-10-CM | POA: Diagnosis present

## 2013-02-02 DIAGNOSIS — Z681 Body mass index (BMI) 19 or less, adult: Secondary | ICD-10-CM

## 2013-02-02 DIAGNOSIS — J9601 Acute respiratory failure with hypoxia: Secondary | ICD-10-CM | POA: Diagnosis present

## 2013-02-02 DIAGNOSIS — R7401 Elevation of levels of liver transaminase levels: Secondary | ICD-10-CM | POA: Diagnosis present

## 2013-02-02 DIAGNOSIS — E876 Hypokalemia: Secondary | ICD-10-CM | POA: Diagnosis present

## 2013-02-02 DIAGNOSIS — J9600 Acute respiratory failure, unspecified whether with hypoxia or hypercapnia: Secondary | ICD-10-CM | POA: Diagnosis present

## 2013-02-02 DIAGNOSIS — R531 Weakness: Secondary | ICD-10-CM

## 2013-02-02 DIAGNOSIS — R64 Cachexia: Secondary | ICD-10-CM | POA: Diagnosis present

## 2013-02-02 HISTORY — DX: Major depressive disorder, single episode, unspecified: F32.9

## 2013-02-02 HISTORY — DX: Peripheral vascular disease, unspecified: I73.9

## 2013-02-02 HISTORY — DX: Heart failure, unspecified: I50.9

## 2013-02-02 HISTORY — DX: Gastro-esophageal reflux disease without esophagitis: K21.9

## 2013-02-02 HISTORY — DX: Altered mental status, unspecified: R41.82

## 2013-02-02 HISTORY — DX: Anemia, unspecified: D64.9

## 2013-02-02 HISTORY — DX: Dysphasia following unspecified cerebrovascular disease: I69.921

## 2013-02-02 HISTORY — DX: Essential (primary) hypertension: I10

## 2013-02-02 HISTORY — DX: Unspecified osteoarthritis, unspecified site: M19.90

## 2013-02-02 HISTORY — DX: Depression, unspecified: F32.A

## 2013-02-02 LAB — URINE MICROSCOPIC-ADD ON

## 2013-02-02 LAB — URINALYSIS, ROUTINE W REFLEX MICROSCOPIC
Glucose, UA: NEGATIVE mg/dL
Glucose, UA: NEGATIVE mg/dL
Nitrite: NEGATIVE
Protein, ur: NEGATIVE mg/dL
Specific Gravity, Urine: 1.022 (ref 1.005–1.030)
Specific Gravity, Urine: 1.02 (ref 1.005–1.030)
pH: 5 (ref 5.0–8.0)
pH: 5 (ref 5.0–8.0)

## 2013-02-02 LAB — CBC WITH DIFFERENTIAL/PLATELET
Eosinophils Absolute: 0 10*3/uL (ref 0.0–0.7)
HCT: 30.8 % — ABNORMAL LOW (ref 36.0–46.0)
Hemoglobin: 8.8 g/dL — ABNORMAL LOW (ref 12.0–15.0)
Lymphs Abs: 1.1 10*3/uL (ref 0.7–4.0)
MCH: 22.4 pg — ABNORMAL LOW (ref 26.0–34.0)
MCV: 78.6 fL (ref 78.0–100.0)
Monocytes Relative: 11 % (ref 3–12)
Neutrophils Relative %: 80 % — ABNORMAL HIGH (ref 43–77)
RBC: 3.92 MIL/uL (ref 3.87–5.11)

## 2013-02-02 LAB — TROPONIN I: Troponin I: <0.3 ng/mL (ref <0.30)

## 2013-02-02 LAB — COMPREHENSIVE METABOLIC PANEL
Alkaline Phosphatase: 99 U/L (ref 39–117)
BUN: 59 mg/dL — ABNORMAL HIGH (ref 6–23)
GFR calc Af Amer: 35 mL/min — ABNORMAL LOW (ref >90)
Glucose, Bld: 88 mg/dL (ref 70–99)
Potassium: 6.7 mEq/L (ref 3.5–5.1)
Total Bilirubin: 0.5 mg/dL (ref 0.3–1.2)
Total Protein: 5.3 g/dL — ABNORMAL LOW (ref 6.0–8.3)

## 2013-02-02 LAB — MRSA PCR SCREENING: MRSA by PCR: NEGATIVE

## 2013-02-02 LAB — PROTIME-INR: INR: 1.69 — ABNORMAL HIGH (ref 0.00–1.49)

## 2013-02-02 MED ORDER — POLYETHYL GLYCOL-PROPYL GLYCOL 0.4-0.3 % OP GEL: 1.0000 [drp] | Freq: Two times a day (BID) | OPHTHALMIC | Status: DC

## 2013-02-02 MED ORDER — ACETAMINOPHEN 325 MG PO TABS
650.0000 mg | ORAL_TABLET | Freq: Four times a day (QID) | ORAL | Status: DC | PRN
  Administered 2013-02-07 – 2013-02-10 (×4): 650 mg via ORAL
  Filled 2013-02-02 (×4): qty 2

## 2013-02-02 MED ORDER — GUAIFENESIN ER 600 MG PO TB12
600.0000 mg | ORAL_TABLET | Freq: Two times a day (BID) | ORAL | Status: DC
  Filled 2013-02-02: qty 1

## 2013-02-02 MED ORDER — PSEUDOEPHEDRINE HCL 30 MG PO TABS
30.0000 mg | ORAL_TABLET | Freq: Four times a day (QID) | ORAL | Status: DC
  Filled 2013-02-02 (×2): qty 1

## 2013-02-02 MED ORDER — INSULIN ASPART 100 UNIT/ML IV SOLN: 10.0000 [IU] | Freq: Once | INTRAVENOUS | Status: DC

## 2013-02-02 MED ORDER — SODIUM CHLORIDE 0.9 % IV SOLN
INTRAVENOUS | Status: DC
Start: 2013-02-02 — End: 2013-02-11
  Administered 2013-02-09: 12:00:00 via INTRAVENOUS

## 2013-02-02 MED ORDER — SODIUM CHLORIDE 0.9 % IV SOLN
Freq: Once | INTRAVENOUS | Status: AC
  Administered 2013-02-02: 16:00:00 via INTRAVENOUS

## 2013-02-02 MED ORDER — ACETAMINOPHEN 650 MG RE SUPP: 650.0000 mg | Freq: Four times a day (QID) | RECTAL | Status: DC | PRN

## 2013-02-02 MED ORDER — CALCIUM GLUCONATE 10 % IV SOLN
1.0000 g | Freq: Once | INTRAVENOUS | Status: AC
  Administered 2013-02-02: 1 g via INTRAVENOUS
  Filled 2013-02-02: qty 10

## 2013-02-02 MED ORDER — ENOXAPARIN SODIUM 30 MG/0.3ML ~~LOC~~ SOLN
30.0000 mg | SUBCUTANEOUS | Status: DC
  Administered 2013-02-02 – 2013-02-09 (×8): 30 mg via SUBCUTANEOUS
  Filled 2013-02-02 (×9): qty 0.3

## 2013-02-02 MED ORDER — POLYVINYL ALCOHOL 1.4 % OP SOLN
1.0000 [drp] | Freq: Two times a day (BID) | OPHTHALMIC | Status: DC
  Administered 2013-02-02 – 2013-02-11 (×18): 1 [drp] via OPHTHALMIC
  Filled 2013-02-02 (×3): qty 15

## 2013-02-02 MED ORDER — GABAPENTIN 300 MG PO CAPS
600.0000 mg | ORAL_CAPSULE | Freq: Three times a day (TID) | ORAL | Status: DC
  Administered 2013-02-02 – 2013-02-04 (×5): 600 mg via ORAL
  Filled 2013-02-02 (×7): qty 2

## 2013-02-02 MED ORDER — GUAIFENESIN ER 600 MG PO TB12
600.0000 mg | ORAL_TABLET | Freq: Two times a day (BID) | ORAL | Status: DC
  Administered 2013-02-02 – 2013-02-11 (×15): 600 mg via ORAL
  Filled 2013-02-02 (×20): qty 1

## 2013-02-02 MED ORDER — ALBUTEROL SULFATE (5 MG/ML) 0.5% IN NEBU: 2.5000 mg | INHALATION_SOLUTION | RESPIRATORY_TRACT | Status: DC | PRN

## 2013-02-02 MED ORDER — SODIUM CHLORIDE 0.9 % IJ SOLN
3.0000 mL | Freq: Two times a day (BID) | INTRAMUSCULAR | Status: DC
  Administered 2013-02-03 – 2013-02-11 (×5): 3 mL via INTRAVENOUS

## 2013-02-02 MED ORDER — ESCITALOPRAM OXALATE 10 MG PO TABS
10.0000 mg | ORAL_TABLET | Freq: Every day | ORAL | Status: DC
  Administered 2013-02-03 – 2013-02-11 (×9): 10 mg via ORAL
  Filled 2013-02-02 (×9): qty 1

## 2013-02-02 MED ORDER — ALBUTEROL SULFATE (5 MG/ML) 0.5% IN NEBU
2.5000 mg | INHALATION_SOLUTION | Freq: Four times a day (QID) | RESPIRATORY_TRACT | Status: DC
  Administered 2013-02-02: 2.5 mg via RESPIRATORY_TRACT
  Filled 2013-02-02: qty 0.5

## 2013-02-02 MED ORDER — ASPIRIN 81 MG PO CHEW
81.0000 mg | CHEWABLE_TABLET | Freq: Every day | ORAL | Status: DC
  Administered 2013-02-03 – 2013-02-11 (×9): 81 mg via ORAL
  Filled 2013-02-02 (×9): qty 1

## 2013-02-02 MED ORDER — MIRTAZAPINE 7.5 MG PO TABS
7.5000 mg | ORAL_TABLET | Freq: Every day | ORAL | Status: DC
  Administered 2013-02-02 – 2013-02-10 (×9): 7.5 mg via ORAL
  Filled 2013-02-02 (×11): qty 1

## 2013-02-02 MED ORDER — SODIUM BICARBONATE 8.4 % IV SOLN
50.0000 meq | Freq: Once | INTRAVENOUS | Status: AC
  Administered 2013-02-02: 50 meq via INTRAVENOUS
  Filled 2013-02-02: qty 50

## 2013-02-02 MED ORDER — FUROSEMIDE 10 MG/ML IJ SOLN
40.0000 mg | Freq: Once | INTRAMUSCULAR | Status: AC
  Administered 2013-02-02: 40 mg via INTRAVENOUS
  Filled 2013-02-02: qty 4

## 2013-02-02 MED ORDER — IPRATROPIUM BROMIDE 0.02 % IN SOLN
0.5000 mg | Freq: Four times a day (QID) | RESPIRATORY_TRACT | Status: DC
  Administered 2013-02-02: 0.5 mg via RESPIRATORY_TRACT
  Filled 2013-02-02: qty 2.5

## 2013-02-02 MED ORDER — ALBUTEROL SULFATE (5 MG/ML) 0.5% IN NEBU
10.0000 mg | INHALATION_SOLUTION | Freq: Once | RESPIRATORY_TRACT | Status: AC
  Administered 2013-02-02: 10 mg via RESPIRATORY_TRACT

## 2013-02-02 MED ORDER — ONDANSETRON HCL 4 MG/2ML IJ SOLN: 4.0000 mg | Freq: Three times a day (TID) | INTRAMUSCULAR | Status: AC | PRN

## 2013-02-02 MED ORDER — DEXTROSE 50 % IV SOLN
1.0000 | Freq: Once | INTRAVENOUS | Status: DC
  Filled 2013-02-02: qty 50

## 2013-02-02 MED ORDER — GABAPENTIN 600 MG PO TABS: 600.0000 mg | ORAL_TABLET | Freq: Three times a day (TID) | ORAL | Status: DC

## 2013-02-02 MED ORDER — IPRATROPIUM BROMIDE 0.02 % IN SOLN: 0.5000 mg | RESPIRATORY_TRACT | Status: DC | PRN

## 2013-02-02 MED ORDER — ONDANSETRON HCL 4 MG/2ML IJ SOLN: 4.0000 mg | Freq: Four times a day (QID) | INTRAMUSCULAR | Status: DC | PRN

## 2013-02-02 MED ORDER — CALCIUM CARBONATE-VITAMIN D 500-200 MG-UNIT PO TABS
1.0000 | ORAL_TABLET | Freq: Every day | ORAL | Status: DC
  Administered 2013-02-03 – 2013-02-10 (×8): 1 via ORAL
  Filled 2013-02-02 (×8): qty 1

## 2013-02-02 MED ORDER — SODIUM POLYSTYRENE SULFONATE 15 GM/60ML PO SUSP
30.0000 g | Freq: Once | ORAL | Status: AC
  Administered 2013-02-02: 30 g via ORAL
  Filled 2013-02-02: qty 120

## 2013-02-02 MED ORDER — SODIUM CHLORIDE 0.9 % IV SOLN: INTRAVENOUS | Status: DC

## 2013-02-02 MED ORDER — ONDANSETRON HCL 4 MG PO TABS: 4.0000 mg | ORAL_TABLET | Freq: Four times a day (QID) | ORAL | Status: DC | PRN

## 2013-02-02 MED ORDER — SODIUM CHLORIDE 0.9 % IV BOLUS (SEPSIS)
500.0000 mL | Freq: Once | INTRAVENOUS | Status: AC
  Administered 2013-02-02: 500 mL via INTRAVENOUS

## 2013-02-02 MED ORDER — DOCUSATE SODIUM 100 MG PO CAPS
100.0000 mg | ORAL_CAPSULE | Freq: Two times a day (BID) | ORAL | Status: DC
  Administered 2013-02-02 – 2013-02-10 (×10): 100 mg via ORAL
  Filled 2013-02-02 (×17): qty 1

## 2013-02-02 MED ORDER — PSEUDOEPHEDRINE-GUAIFENESIN ER 60-600 MG PO TB12: 1.0000 | ORAL_TABLET | Freq: Two times a day (BID) | ORAL | Status: DC

## 2013-02-02 MED ORDER — DEXTROSE 5 % IV SOLN
1.0000 g | INTRAVENOUS | Status: DC
  Administered 2013-02-02: 1 g via INTRAVENOUS
  Filled 2013-02-02: qty 10

## 2013-02-02 MED ORDER — ADULT MULTIVITAMIN W/MINERALS CH
1.0000 | ORAL_TABLET | Freq: Every day | ORAL | Status: DC
  Administered 2013-02-03 – 2013-02-10 (×8): 1 via ORAL
  Filled 2013-02-02 (×8): qty 1

## 2013-02-02 NOTE — ED Notes (Signed)
EDP [facility] made aware of critical value.

## 2013-02-02 NOTE — H&P (Signed)
[facility] History and Physical  Carrie [name] ZOX:[id number] DOB: 1924-12-14 DOA: 02/02/2013  Referring physician: ER physician PCP: No primary provider on file.   Chief Complaint: bradycardia, desaturation  HPI:  79 year old female with past medical history of dementia, depression who was transferred from [facility] 02/02/2013 due to low heart rate (48) and oxygen saturation of 88%. There was no reports of shortness of breat or fevers in nursing home. Patient is not a good historian due to dementia. Per ED physician, nursing home staff reported patient had complaints of abdominal pain but there was no report of vomiting. No falls or loss of consciousness. In ED, evaluation included CXR which was concerning for pulmonary vascular congestion. Patient had significant electrolyte abnormalities on admission which included hyperkalemia of 6.7, creatinine of 1.52 and INR of 1.69. CBC revealed WBC count of 11.9 and hemoglobin of 8.8.  Patient was given the following in ED to reduce the potassium level: albuterol nebulizer, insulin 10 units (along with D50), kayexalate and calcium gluconate. Repeat potassium level is pending.  Assessment and Plan:  Principal Problem:   *Acute respiratory failure with hypoxia  Possibly due to pulmonary vascular congestion  One dose of lasix given in ED 40 mg IV  Continue nebulizer treatments and oxygen support via nasal canula to keep O2 saturation above 90% Active Problems: Abdominal Pain  Likely due to UTI or hyperkalemia  Continue rocephin and follow up potassium level   UTI (lower urinary tract infection)  Urinalysis had leukocyte esterases on initial admission lab.  Patient was started on rocephin empirically  Follow up urine culture results.   Leukocytosis  Likely due to UTI  Continue rocephin started in ED   Hyperkalemia  Pt is on potassium supplementation in NH. Stopped on admission  Pt given kayexalate, albuterol, insulin and calcium  gluconate in ED  Follow up repeat potassium level   Anemia  Hemoglobin 8.8 on admission  No active bleed  Follow up CBC in am   Coagulopathy  Unclear etiology  Repeat INR value   Acute kidney failure  Secondary to dehydration and UTI  Follow up renal function in am  Given one dose of lasix due to pulmonary congestion   Dementia  stable  Code Status: Full Family Communication: Pt at bedside Disposition Plan: Admit for further evaluation  Manson Passey, MD  [facility] Pager [phone]  If 7PM-7AM, please contact night-coverage [email or web address] Password [email or web address] 02/02/2013, 6:38 PM  Review of Systems:  Constitutional: Negative for fever, chills and malaise/fatigue. Negative for diaphoresis.  HENT: Negative for hearing loss, ear pain, nosebleeds, congestion, sore throat, neck pain, tinnitus and ear discharge.   Eyes: Negative for blurred vision, double vision, photophobia, pain, discharge and redness.  Respiratory: Negative for cough, hemoptysis, sputum production, shortness of breath, wheezing and stridor.   Cardiovascular: Negative for chest pain, palpitations, orthopnea, claudication and leg swelling.  Gastrointestinal: Negative for nausea, vomiting and positive for abdominal pain. Negative for heartburn, constipation, blood in stool and melena.  Genitourinary: Negative for dysuria, urgency, frequency, hematuria and flank pain.  Musculoskeletal: Negative for myalgias, back pain, joint pain and falls.  Skin: Negative for itching and rash.  Neurological: Negative for dizziness and weakness. Negative for tingling, tremors, sensory change, speech change, focal weakness, loss of consciousness and headaches.  Endo/Heme/Allergies: Negative for environmental allergies and polydipsia. Does not bruise/bleed easily.  Psychiatric/Behavioral: Negative for suicidal ideas. The patient is not nervous/anxious.      Past Medical History  Diagnosis Date  . CHF (  congestive heart failure)   .  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 Esophageal reflux   . Peripheral vascular disease   . Altered mental status   . Osteoarthrosis   . Hypertension   . Depressive disorder    History reviewed. No pertinent past surgical history. Social History:  has no tobacco, alcohol, and drug history on file.  No Known Allergies  Family History: htn in parents.  Prior to Admission medications   Medication Sig Start Date End Date Taking? Authorizing Provider  aspirin 81 MG tablet Take 81 mg by mouth daily.   Yes Historical Provider, MD  calcium-vitamin D (OSCAL WITH D) 500-200 MG-UNIT per tablet Take 1 tablet by mouth daily.   Yes Historical Provider, MD  Cyanocobalamin (VITAMIN B 12 PO) Take 1,000 mcg by mouth daily.   Yes Historical Provider, MD  docusate sodium (COLACE) 100 MG capsule Take 100 mg by mouth 2 (two) times daily.   Yes Historical Provider, MD  escitalopram (LEXAPRO) 10 MG tablet Take 10 mg by mouth daily.   Yes Historical Provider, MD  furosemide (LASIX) 40 MG tablet Take 40 mg by mouth daily.   Yes Historical Provider, MD  gabapentin (NEURONTIN) 600 MG tablet Take 600 mg by mouth 3 (three) times daily.   Yes Historical Provider, MD  mirtazapine (REMERON) 7.5 MG tablet Take 7.5 mg by mouth at bedtime.   Yes Historical Provider, MD  Multiple Vitamins-Minerals (MULTIVITAMIN WITH MINERALS) tablet Take 1 tablet by mouth daily.   Yes Historical Provider, MD  Polyethyl Glycol-Propyl Glycol (SYSTANE) 0.4-0.3 % GEL Apply 1 drop to eye 2 (two) times daily.   Yes Historical Provider, MD  potassium chloride (K-DUR) 10 MEQ tablet Take 10 mEq by mouth daily.   Yes Historical Provider, MD  pseudoephedrine-guaifenesin (MUCINEX D) 60-600 MG per tablet Take 1 tablet by mouth every 12 (twelve) hours.   Yes Historical Provider, MD  ranitidine (ZANTAC) 150 MG tablet Take 150 mg by mouth at bedtime.   Yes Historical Provider, MD  tamsulosin (FLOMAX) 0.4 MG CAPS Take 0.4 mg by mouth daily.   Yes  Historical Provider, MD   Physical Exam: Filed Vitals:   02/02/13 1525 02/02/13 1532 02/02/13 1618  Pulse:  53   Temp:   99.8 F (37.7 C)  TempSrc:   Rectal  Resp:  19   SpO2: 99% 100%     Physical Exam  Constitutional: Appears in no acute distress.  HENT: Normocephalic. Dry mucus membranes  Eyes: Conjunctivae and EOM are normal. PERRLA, no scleral icterus.  Neck: Neck supple. No JVD. No tracheal deviation. No thyromegaly.  CVS: RRR, S1/S2 + appreciated Pulmonary: diminished breath sounds, no wheezing Abdominal: Soft. BS +,  no distension, tenderness, rebound or guarding.  Musculoskeletal: Normal range of motion. No edema and no tenderness.  Lymphadenopathy: No lymphadenopathy noted, cervical, inguinal. Neuro: Alert. No focal neurologic deficits. Skin: Skin is warm and dry. No rash noted. Not diaphoretic. No erythema. No pallor.   Labs on Admission:  Basic Metabolic Panel:  Recent Labs Lab 02/02/13 1644  NA 134*  K 6.7*  CL 99  CO2 20  GLUCOSE 88  BUN 59*  CREATININE 1.52*  CALCIUM 8.3*  MG 2.0   Liver Function Tests:  Recent Labs Lab 02/02/13 1644  AST 112*  ALT 94*  ALKPHOS 99  BILITOT 0.5  PROT 5.3*  ALBUMIN 2.7*   No results found for this basename: LIPASE, AMYLASE,  in the last 168 hours No results  found for this basename: AMMONIA,  in the last 168 hours CBC:  Recent Labs Lab 02/02/13 1644  WBC 11.9*  NEUTROABS 9.5*  HGB 8.8*  HCT 30.8*  MCV 78.6  PLT 352   Cardiac Enzymes:  Recent Labs Lab 02/02/13 1644  TROPONINI <0.30   BNP: No components found with this basename: POCBNP,  CBG:  Recent Labs Lab 02/02/13 1803  GLUCAP 87    Radiological Exams on Admission: Dg Chest Portable 1 View 02/02/2013  *  IMPRESSION: Cardiomegaly with pulmonary vascular congestion and suspected mild interstitial edema.  Mild patchy left basilar opacity, atelectasis versus pneumonia.   Original Report Authenticated By: Charline Bills, M.D.     Time  spent: 75 minutes

## 2013-02-02 NOTE — ED Notes (Signed)
ZOX:WRUE<AV> Expected date:02/02/13<BR> Expected time: 3:16 PM<BR> Means of arrival:Ambulance<BR> Comments:<BR> Bradycardia, eval

## 2013-02-02 NOTE — Progress Notes (Signed)
pt arrived to ICU at change of shift. ED RN who transported pt and day RN who received report did not know if pt received insulin and D50 for potassium correction. D50 was removed from pyxis 3/9 at 1803 according to pyxis report. Appears that dose was given but not charted. No ED RN able to clarify when called.  traid NP notified and he agreed not to give. lasix and kayexelate given and order for repeat BMP received. Pacemaker at bedside, as ordered. Will continue to monitor closely.

## 2013-02-02 NOTE — ED Notes (Signed)
Critical Lab value Potassium 6.7 called from lab by Vonna Kotyk.

## 2013-02-02 NOTE — ED Notes (Signed)
Per EMS pt comes from [facility] where pt has had abd pain for two days but denies pain today. Pt bp at nursing facility was 92/55 HR 107.  Per EMS pt been bradycardic with them HR 48 and pale. Pt was 88-92% on 2L O2 and EMS placed pt on NRB pt O2 sats came up to 98%. Pt has been fidgety for several weeks and doesn't know why.

## 2013-02-02 NOTE — ED Provider Notes (Signed)
History     CSN: [id number]  Arrival date & time 02/02/13  1556   First MD Initiated Contact with Patient 02/02/13 1553      Chief Complaint  Patient presents with  . Bradycardia    (Consider location/radiation/quality/duration/timing/severity/associated sxs/prior treatment) HPI Comments: Patient from [facility] with bradycardia and weakness. Found to be hypoxic in the low 90s at her nursing home. She denies complaint. She states she feels fine denies any chest pain, shortness of breath, nausea, vomiting or abdominal pain. No leg pain or swelling. She does not know why she is here.  The history is provided by the patient and the EMS personnel.    Past Medical History  Diagnosis Date  . CHF (congestive heart failure)   . 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 Esophageal reflux   . Peripheral vascular disease   . Altered mental status   . Osteoarthrosis   . Hypertension   . Depressive disorder     History reviewed. No pertinent past surgical history.  No family history on file.  History  Substance Use Topics  . Smoking status: Former Games developer  . Smokeless tobacco: Not on file  . Alcohol Use: Not on file    OB History   Grav Para Term Preterm Abortions TAB SAB Ect Mult Living                  Review of Systems  Constitutional: Positive for fatigue. Negative for fever, activity change and appetite change.  HENT: Negative for congestion and rhinorrhea.   Respiratory: Negative for cough, chest tightness and shortness of breath.   Cardiovascular: Negative for chest pain and palpitations.  Gastrointestinal: Negative for nausea, vomiting and abdominal pain.  Genitourinary: Negative for dysuria, vaginal bleeding and vaginal discharge.  Musculoskeletal: Negative for back pain.  Skin: Negative for rash.  Neurological: Negative for dizziness, weakness and headaches.  A complete 10 system review of systems was obtained and all systems are negative except as  noted in the HPI and PMH.    Allergies  Review of patient's allergies indicates no known allergies.  Home Medications  No current outpatient prescriptions on file.  BP 115/73  Pulse 71  Temp(Src) 98.9 F (37.2 C) (Oral)  Resp 16  Ht 5' (1.524 m)  Wt 83 lb 15.9 oz (38.1 kg)  BMI 16.4 kg/m2  SpO2 91%  Physical Exam  Constitutional: She is oriented to person, place, and time. She appears well-developed and well-nourished.  HENT:  Head: Normocephalic and atraumatic.  Mouth/Throat: No oropharyngeal exudate.  Dry mucus membranes  Eyes: Conjunctivae and EOM are normal. Pupils are equal, round, and reactive to light.  Neck: Normal range of motion. Neck supple.  Cardiovascular: Normal rate and normal heart sounds.   bradycardia  Pulmonary/Chest: Effort normal and breath sounds normal. No respiratory distress.  Abdominal: Soft. There is no tenderness. There is no rebound and no guarding.  Musculoskeletal: Normal range of motion. She exhibits no edema and no tenderness.  Neurological: She is alert and oriented to person, place, and time. No cranial nerve deficit. She exhibits normal muscle tone. Coordination normal.  Skin: Skin is warm.    ED Course  Procedures (including critical care time)  Labs Reviewed  URINALYSIS, ROUTINE W REFLEX MICROSCOPIC - Abnormal; Notable for the following:    APPearance CLOUDY (*)    Hgb urine dipstick MODERATE (*)    Bilirubin Urine MODERATE (*)    Protein, ur 30 (*)  Leukocytes, UA SMALL (*)    All other components within normal limits  CBC WITH DIFFERENTIAL - Abnormal; Notable for the following:    WBC 11.9 (*)    Hemoglobin 8.8 (*)    HCT 30.8 (*)    MCH 22.4 (*)    MCHC 28.6 (*)    RDW 19.5 (*)    Neutrophils Relative 80 (*)    Neutro Abs 9.5 (*)    Lymphocytes Relative 9 (*)    Monocytes Absolute 1.3 (*)    All other components within normal limits  COMPREHENSIVE METABOLIC PANEL - Abnormal; Notable for the following:    Sodium 134  (*)    Potassium 6.7 (*)    BUN 59 (*)    Creatinine, Ser 1.52 (*)    Calcium 8.3 (*)    Total Protein 5.3 (*)    Albumin 2.7 (*)    AST 112 (*)    ALT 94 (*)    GFR calc non Af Amer 30 (*)    GFR calc Af Amer 35 (*)    All other components within normal limits  PRO B NATRIURETIC PEPTIDE - Abnormal; Notable for the following:    Pro B Natriuretic peptide (BNP) 22537.0 (*)    All other components within normal limits  PROTIME-INR - Abnormal; Notable for the following:    Prothrombin Time 19.3 (*)    INR 1.69 (*)    All other components within normal limits  URINE MICROSCOPIC-ADD ON - Abnormal; Notable for the following:    Bacteria, UA MANY (*)    Casts HYALINE CASTS (*)    All other components within normal limits  URINALYSIS, ROUTINE W REFLEX MICROSCOPIC - Abnormal; Notable for the following:    APPearance CLOUDY (*)    Hgb urine dipstick SMALL (*)    Bilirubin Urine SMALL (*)    Leukocytes, UA MODERATE (*)    All other components within normal limits  URINE MICROSCOPIC-ADD ON - Abnormal; Notable for the following:    Bacteria, UA MANY (*)    Casts HYALINE CASTS (*)    All other components within normal limits  MRSA PCR SCREENING  CULTURE, BLOOD (ROUTINE X 2)  CULTURE, BLOOD (ROUTINE X 2)  URINE CULTURE  TROPONIN I  MAGNESIUM  GLUCOSE, CAPILLARY  PROCALCITONIN  CBC  COMPREHENSIVE METABOLIC PANEL  MAGNESIUM  PHOSPHORUS  CBC WITH DIFFERENTIAL  APTT  PROTIME-INR  TSH  COMPREHENSIVE METABOLIC PANEL  CBC   Dg Chest Portable 1 View  02/02/2013  *RADIOLOGY REPORT*  Clinical Data: Bradycardia, history of CHF  PORTABLE CHEST - 1 VIEW  Comparison: 01/15/2010  Findings: Cardiomegaly with pulmonary vascular congestion and suspected mild interstitial edema.  Mild patchy left basilar opacity, atelectasis versus pneumonia.  Underlying chronic interstitial markings.  No pneumothorax.  Left lung base is obscured by defibrillator pads.  IMPRESSION: Cardiomegaly with pulmonary  vascular congestion and suspected mild interstitial edema.  Mild patchy left basilar opacity, atelectasis versus pneumonia.   Original Report Authenticated By: Charline Bills, M.D.      1. Hyperkalemia   2. Acute kidney failure   3. Acute respiratory failure with hypoxia   4. Coagulopathy       MDM  From nursing home with bradycardia and weakness. Patient denies symptoms. Initial EKG shows sinus bradycardia at 49.  D/w cardiologist on call. Bradycardia has converted to sinus tachycardia. He recommends admission to medicine for monitoring. They willconsult needed.  Hyperkalemic at 6.7. Patient is given IV calcium, bicarbonate, insulin,  dextrose. This is likely attributes patient's arrhythmias.  Mild patchy vascular congestion on chest x-ray could explain patient's oxygen requirement. BNP is elevated. Patient given dose of Lasix and hyperkalemia treatment as above. +UA.    Date: 02/02/2013  Rate: 49  Rhythm: sinus bradycardia  QRS Axis: normal  Intervals: normal  ST/T Wave abnormalities: normal  Conduction Disutrbances:none  Narrative Interpretation:   Old EKG Reviewed: changes noted    Date: 02/02/2013  Rate: 108  Rhythm: sinus tachycardia  QRS Axis: normal  Intervals: normal  ST/T Wave abnormalities: nonspecific ST/T changes  Conduction Disutrbances:none  Narrative Interpretation:   Old EKG Reviewed: changes noted  CRITICAL CARE Performed by: Glynn Octave   Total critical care time: 45  Critical care time was exclusive of separately billable procedures and treating other patients.  Critical care was necessary to treat or prevent imminent or life-threatening deterioration.  Critical care was time spent personally by me on the following activities: development of treatment plan with patient and/or surrogate as well as nursing, discussions with consultants, evaluation of patient's response to treatment, examination of patient, obtaining history from patient or  surrogate, ordering and performing treatments and interventions, ordering and review of laboratory studies, ordering and review of radiographic studies, pulse oximetry and re-evaluation of patient's condition.   Glynn Octave, MD 02/03/13 [facility] Manners

## 2013-02-03 ENCOUNTER — Inpatient Hospital Stay (HOSPITAL_COMMUNITY): Payer: Medicare HMO

## 2013-02-03 ENCOUNTER — Encounter (HOSPITAL_COMMUNITY): Payer: Self-pay

## 2013-02-03 ENCOUNTER — Ambulatory Visit (HOSPITAL_COMMUNITY): Payer: Medicare HMO

## 2013-02-03 DIAGNOSIS — I5031 Acute diastolic (congestive) heart failure: Secondary | ICD-10-CM

## 2013-02-03 DIAGNOSIS — I5033 Acute on chronic diastolic (congestive) heart failure: Principal | ICD-10-CM

## 2013-02-03 DIAGNOSIS — R5381 Other malaise: Secondary | ICD-10-CM

## 2013-02-03 DIAGNOSIS — F0390 Unspecified dementia without behavioral disturbance: Secondary | ICD-10-CM

## 2013-02-03 DIAGNOSIS — I509 Heart failure, unspecified: Secondary | ICD-10-CM

## 2013-02-03 DIAGNOSIS — M79609 Pain in unspecified limb: Secondary | ICD-10-CM

## 2013-02-03 DIAGNOSIS — N179 Acute kidney failure, unspecified: Secondary | ICD-10-CM

## 2013-02-03 DIAGNOSIS — M7989 Other specified soft tissue disorders: Secondary | ICD-10-CM

## 2013-02-03 LAB — TROPONIN I
Troponin I: <0.3 ng/mL (ref <0.30)
Troponin I: <0.3 ng/mL (ref <0.30)

## 2013-02-03 LAB — CBC
HCT: 29.6 % — ABNORMAL LOW (ref 36.0–46.0)
Hemoglobin: 8.4 g/dL — ABNORMAL LOW (ref 12.0–15.0)
MCH: 22 pg — ABNORMAL LOW (ref 26.0–34.0)
MCHC: 28.4 g/dL — ABNORMAL LOW (ref 30.0–36.0)

## 2013-02-03 LAB — COMPREHENSIVE METABOLIC PANEL
ALT: 118 U/L — ABNORMAL HIGH (ref 0–35)
AST: 130 U/L — ABNORMAL HIGH (ref 0–37)
Albumin: 2.9 g/dL — ABNORMAL LOW (ref 3.5–5.2)
Alkaline Phosphatase: 101 U/L (ref 39–117)
BUN: 59 mg/dL — ABNORMAL HIGH (ref 6–23)
CO2: 27 mEq/L (ref 19–32)
CO2: 28 mEq/L (ref 19–32)
Calcium: 7.9 mg/dL — ABNORMAL LOW (ref 8.4–10.5)
Chloride: 93 mEq/L — ABNORMAL LOW (ref 96–112)
Creatinine, Ser: 1.42 mg/dL — ABNORMAL HIGH (ref 0.50–1.10)
GFR calc non Af Amer: 33 mL/min — ABNORMAL LOW (ref >90)
GFR calc non Af Amer: 35 mL/min — ABNORMAL LOW (ref >90)
Glucose, Bld: 144 mg/dL — ABNORMAL HIGH (ref 70–99)
Potassium: 4.2 mEq/L (ref 3.5–5.1)
Sodium: 136 mEq/L (ref 135–145)
Total Bilirubin: 0.3 mg/dL (ref 0.3–1.2)

## 2013-02-03 LAB — CBC WITH DIFFERENTIAL/PLATELET
Eosinophils Absolute: 0 10*3/uL (ref 0.0–0.7)
Eosinophils Relative: 0 % (ref 0–5)
Hemoglobin: 8.6 g/dL — ABNORMAL LOW (ref 12.0–15.0)
Lymphs Abs: 0.6 10*3/uL — ABNORMAL LOW (ref 0.7–4.0)
MCH: 22.2 pg — ABNORMAL LOW (ref 26.0–34.0)
MCV: 78 fL (ref 78.0–100.0)
Monocytes Absolute: 1.3 10*3/uL — ABNORMAL HIGH (ref 0.1–1.0)
Monocytes Relative: 13 % — ABNORMAL HIGH (ref 3–12)
RBC: 3.87 MIL/uL (ref 3.87–5.11)

## 2013-02-03 LAB — MAGNESIUM: Magnesium: 2 mg/dL (ref 1.5–2.5)

## 2013-02-03 MED ORDER — SODIUM CHLORIDE 0.9 % IV BOLUS (SEPSIS)
250.0000 mL | Freq: Once | INTRAVENOUS | Status: AC
  Administered 2013-02-03: 250 mL via INTRAVENOUS

## 2013-02-03 MED ORDER — PIPERACILLIN-TAZOBACTAM 3.375 G IVPB 30 MIN
3.3750 g | Freq: Three times a day (TID) | INTRAVENOUS | Status: DC
  Administered 2013-02-03 – 2013-02-04 (×4): 3.375 g via INTRAVENOUS
  Filled 2013-02-03 (×5): qty 50

## 2013-02-03 MED ORDER — BIOTENE DRY MOUTH MT LIQD
15.0000 mL | Freq: Two times a day (BID) | OROMUCOSAL | Status: DC
  Administered 2013-02-04 – 2013-02-11 (×14): 15 mL via OROMUCOSAL

## 2013-02-03 NOTE — Evaluation (Signed)
Physical Therapy Evaluation Patient Details Name: Carrie [name] MRN: [id number] DOB: 1925/01/06 Today's Date: 02/03/2013 Time: 7829-5621 PT Time Calculation (min): 24 min  PT Assessment / Plan / Recommendation Clinical Impression  Pt presents with acute respiratory failure with hypoxia and history of dementia from [facility].  Tolerated sitting at EOB x approx 6-8 mins, however requires total assist to prevent posterior fall.  Also noted that pt has absent eccentric control for all motor activity.  she was able to actively ext knees, perform shoulder flexion and cervical ext, however unable to maintain and would "flop" back into resting position.  Feel that pt would greatly benefit from soft cervical collar to assist with better head control.  Will keep pt on caseload for trial basis to address deficits.  PT recommends return to SNF at D/C.     PT Assessment  Patient needs continued PT services    Follow Up Recommendations  SNF;Supervision/Assistance - 24 hour    Does the patient have the potential to tolerate intense rehabilitation      Barriers to Discharge None      Equipment Recommendations  None recommended by PT    Recommendations for Other Services     Frequency Min 2X/week    Precautions / Restrictions Precautions Precautions: Fall Precaution Comments: Per pts sister, she was being assisted into w/c at [facility], poor eccentric control of movements.  Restrictions Weight Bearing Restrictions: No   Pertinent Vitals/Pain No pain      Mobility  Bed Mobility Bed Mobility: Sit to Supine;Supine to Sit Supine to Sit: 1: +2 Total assist Supine to Sit: Patient Percentage: 10% Sit to Supine: 1: +2 Total assist Sit to Supine: Patient Percentage: 0% Details for Bed Mobility Assistance: Assist for BLEs out of bed (pt only able to assist somewhat with LEs) and for trunk to attain sitting position with max cues for hand placement and self assisting, however unable to  assist.  Transfers Transfers: Not assessed Ambulation/Gait Ambulation/Gait Assistance: Not tested (comment)    Exercises     PT Diagnosis: Difficulty walking;Generalized weakness  PT Problem List: Decreased strength;Decreased range of motion;Decreased activity tolerance;Decreased balance;Decreased mobility;Decreased safety awareness;Cardiopulmonary status limiting activity PT Treatment Interventions: Functional mobility training;Therapeutic activities;Therapeutic exercise;Balance training;Patient/family education   PT Goals Acute Rehab PT Goals PT Goal Formulation: With family Time For Goal Achievement: 02/10/13 Potential to Achieve Goals: Fair Pt will Roll Supine to Right Side: with max assist PT Goal: Rolling Supine to Right Side - Progress: Goal set today Pt will Roll Supine to Left Side: with max assist PT Goal: Rolling Supine to Left Side - Progress: Goal set today Pt will go Supine/Side to Sit: with max assist PT Goal: Supine/Side to Sit - Progress: Goal set today Pt will Sit at Edge of Bed: with mod assist;3-5 min;with bilateral upper extremity support PT Goal: Sit at Edge Of Bed - Progress: Goal set today Pt will go Sit to Supine/Side: with max assist PT Goal: Sit to Supine/Side - Progress: Goal set today Additional Goals Additional Goal #1: Pt will demonstrate midline head control x 1 min for improved posture/scanning of environment.  PT Goal: Additional Goal #1 - Progress: Goal set today  Visit Information  Last PT Received On: 02/03/13 Assistance Needed: +2    Subjective Data  Subjective: I want to go home Patient Stated Goal: same as above   Prior Functioning  Home Living Available Help at Discharge: Skilled Nursing Facility Type of Home: Skilled Nursing Facility Prior Function  Comments: Again, per pts sister, pt was being assisted into w/c at [facility],  Pts sister was unsure if pt was able to help at all and pt is poor historian with history of dementia.   Communication Communication: HOH    Cognition  Cognition Overall Cognitive Status: History of cognitive impairments - at baseline Arousal/Alertness: Lethargic Orientation Level: Disoriented to;Place;Time;Situation Behavior During Session: Lethargic    Extremity/Trunk Assessment Right Lower Extremity Assessment RLE ROM/Strength/Tone: Deficits;Unable to fully assess;Due to impaired cognition RLE ROM/Strength/Tone Deficits: Pt able to perform knee ext on command, however unable to eccentrically control movements and LEs would flop and hit bead.  Also noted this with UE's and neck movements.  Left Lower Extremity Assessment LLE ROM/Strength/Tone: Deficits LLE ROM/Strength/Tone Deficits: Pt able to perform knee ext on command, however unable to eccentrically control movements and LEs would flop and hit bead.  Also noted this with UE's and neck movements.  Trunk Assessment Trunk Assessment: Kyphotic;Other exceptions Trunk Exceptions: Pt with very poor head control, she could perform cervical extension with cues, however unable to maintain.    Balance Balance Balance Assessed: Yes Static Sitting Balance Static Sitting - Balance Support: Bilateral upper extremity supported Static Sitting - Level of Assistance: 1: +1 Total assist Static Sitting - Comment/# of Minutes: Pt unable to hold herself up in upright position at EOB.  Provided max cues for forward leaning and she could perform, but unable to maintain.  Also noted that with forward leaning to correct posture, pts head control would decrease even more.   End of Session PT - End of Session Equipment Utilized During Treatment: Oxygen Activity Tolerance: Patient limited by fatigue Patient left: in bed;with call bell/phone within reach;with bed alarm set Nurse Communication: Mobility status;Need for lift equipment  GP     Vista Deck 02/03/2013, 3:44 PM

## 2013-02-03 NOTE — Clinical Social Work Note (Signed)
CSW attempted to see, Pt admitted from [facility]. Pt in procedure out of ICU. CSW will assess in am and do paperwork to facilitate return to SNF.   Doreen Salvage, LCSW ICU/Stepdown Clinical Social Worker [facility] Cell [phone] Hours 8am-1200pm M-F

## 2013-02-03 NOTE — Plan of Care (Signed)
Problem: Phase II Progression Outcomes Goal: Discharge plan established Outcome: Completed/Met Date Met:  02/03/13 Plan to return to [facility] after discharge.

## 2013-02-03 NOTE — Progress Notes (Signed)
PT unable to hold up when sitting. Cannot hold cup with right hand. Requesting water but cannot swallow. MD notified.

## 2013-02-03 NOTE — Progress Notes (Addendum)
Dr. Arbutus Leas notified of BP 89/45 with last two checks being equally low. Dr. Arbutus Leas okay with low pressures at this time as long a patient is asymptomatic.

## 2013-02-03 NOTE — Evaluation (Signed)
Clinical/Bedside Swallow Evaluation Patient Details  Name: Carrie [name] MRN: [id number] Date of Birth: Mar 20, 1925  Today's Date: 02/03/2013 Time: 1230-1310 SLP Time Calculation (min): 40 min  Past Medical History:  Past Medical History  Diagnosis Date  . CHF (congestive heart failure)   . 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 Esophageal reflux   . Peripheral vascular disease   . Altered mental status   . Osteoarthrosis   . Hypertension   . Depressive disorder    Past Surgical History: History reviewed. No pertinent past surgical history. HPI:  79 year old female with past medical history of dementia, depression who was transferred from [facility] 02/02/2013 due to low heart rate (48) and oxygen saturation of 88%. There was no reports of shortness of breat or fevers in nursing home. Patient is not a good historian due to dementia. Per ED physician, nursing home staff reported patient had complaints of abdominal pain but there was no report of vomiting. No falls or loss of consciousness.   Assessment / Plan / Recommendation Clinical Impression  Patient exhibits positive s/s of dysphagia with suspected aspiration with thin and nectar thick liquids, with coughing  and throat clearing after swallows.  Patient was unable to maintain head in upright position (head drops forward when upright) causing bolus to fall forward and spill anteriorly from oral cavity.  When reclined, patient is able to rest head on bed, however, this position is more likely to cause aspiration.  Patient also attempted frequently to reach for the cup or spoon, but was unable to hold her hands up, and hands would immediately drop to her lap.  Question if there is a mucle disorder/ myopathy that may be the cause of her dysphagia as well.  Sister reports patient was able to feed herself, wheel herself in a wheelchair, and did not have difficulty with head control prior to this admission.  A soft cervical collar may be beneficial  for added head support.  Discussed above with Dr. Arbutus Leas.  Patient may also benefit from a neurology consult.    Aspiration Risk  Moderate    Diet Recommendation Dysphagia 2 (Fine chop);Honey-thick liquid   Liquid Administration via: Cup Medication Administration: Crushed with puree Supervision: Staff feed patient;Full supervision/cueing for compensatory strategies Compensations: Slow rate;Small sips/bites Postural Changes and/or Swallow Maneuvers: Out of bed for meals;Seated upright 90 degrees (Cervical collar for head control)    Other  Recommendations Recommended Consults: MBS (Consider Neurology Consult) Oral Care Recommendations: Oral care QID;Staff/trained caregiver to provide oral care Other Recommendations: Order thickener from pharmacy;Prohibited food (jello, ice cream, thin soups);Have oral suction available;Clarify dietary restrictions   Follow Up Recommendations  Skilled Nursing facility    Frequency and Duration min 2x/week  2 weeks   Pertinent Vitals/Pain n/a    SLP Swallow Goals Patient will consume recommended diet without observed clinical signs of aspiration with: Maximum assistance Patient will utilize recommended strategies during swallow to increase swallowing safety with: Maximum assistance   Swallow Study Prior Functional Status       General HPI: 79 year old female with past medical history of dementia, depression who was transferred from [facility] 02/02/2013 due to low heart rate (48) and oxygen saturation of 88%. There was no reports of shortness of breat or fevers in nursing home. Patient is not a good historian due to dementia. Per ED physician, nursing home staff reported patient had complaints of abdominal pain but there was no report of vomiting. No falls or loss of  consciousness. Type of Study: Bedside swallow evaluation Previous Swallow Assessment: None in [facility] Diet Prior to this Study: NPO (Regular, thin at SNF) Temperature Spikes Noted:  No Respiratory Status: Supplemental O2 delivered via (comment) History of Recent Intubation: No Behavior/Cognition: Alert;Cooperative;Confused;Distractible;Requires cueing;Decreased sustained attention Oral Cavity - Dentition: Dentures, top;Dentures, bottom Self-Feeding Abilities: Total assist (Fed self at [facility]; now needs total assist) Patient Positioning: Postural control interferes with function (Decreased head support when upright) Baseline Vocal Quality: Clear Volitional Cough: Weak Volitional Swallow: Unable to elicit    Oral/Motor/Sensory Function Overall Oral Motor/Sensory Function: Appears within functional limits for tasks assessed   Ice Chips Ice chips: Impaired Presentation: Spoon Pharyngeal Phase Impairments: Throat Clearing - Delayed   Thin Liquid Thin Liquid: Impaired Presentation: Spoon;Cup;Straw Oral Phase Impairments: Reduced labial seal (Head falls forward and bolus is lost anteriorly) Pharyngeal  Phase Impairments: Multiple swallows;Throat Clearing - Immediate;Cough - Immediate;Change in Vital Signs (O2 Sats decreased to 89%)    Nectar Thick Nectar Thick Liquid: Impaired Presentation: Cup Pharyngeal Phase Impairments: Multiple swallows;Throat Clearing - Immediate   Honey Thick Honey Thick Liquid: Impaired   Puree Puree: Within functional limits Presentation: Spoon   Solid   GO    Solid: Impaired Presentation: Spoon Oral Phase Impairments: Reduced lingual movement/coordination       Carrie [name] T 02/03/2013,2:39 PM

## 2013-02-03 NOTE — Progress Notes (Addendum)
[facility] PROGRESS NOTE  Zannie Locastro UJW:[id number] DOB: [birth year] DOA: 02/02/2013 PCP: No primary provider on file.  Assessment/Plan: Acute hypoxemic respiratory failure -Patient received one dose furosemide 40 mg IV 02/02/2013 2030 -Patient urinated approximately 1200 cc -Likely acute CHF although differential diagnosis includes COPD and pneumonia Acute diastolic CHF -Echocardiogram 09/07/2009--EF 50-55%, grade 1 diastolic dysfunction -Repeat echocardiogram -Repeat chest x-ray -Hold furosemide for now as blood pressure is marginally low Pulmonary infiltrates -Likely CHF although COPD and pneumonia are in the differential diagnosis -Repeat chest x-ray this morning since patient has diuresed to reevaluate infiltrates--chest x-ray 02/02/2013 revealed left upper lobe opacity with questionable right lower lobe opacity Chest pain -Patient complaint of chest discomfort this morning -Cycle troponins Leukocytosis -WBC 11.9 at the time of admission, temperature 99.58F -broaden abx coverage pending clinical and lab data Hyperkalemia -Resolved with Kayexalate Acute on chronic renal failure -Serum creatinine 0.7-0.8 in February 2011 -Renal ultrasound Transaminasemia -Likely hepatic congestion -Abdominal ultrasound     Family Communication:   Pt at beside Disposition Plan:   Back to SNF when medically stable     Antibiotics:  Ceftriaxone 02/02/2013>>>       Procedures/Studies: Dg Chest Portable 1 View  02/02/2013  *RADIOLOGY REPORT*  Clinical Data: Bradycardia, history of CHF  PORTABLE CHEST - 1 VIEW  Comparison: 01/15/2010  Findings: Cardiomegaly with pulmonary vascular congestion and suspected mild interstitial edema.  Mild patchy left basilar opacity, atelectasis versus pneumonia.  Underlying chronic interstitial markings.  No pneumothorax.  Left lung base is obscured by defibrillator pads.  IMPRESSION: Cardiomegaly with pulmonary vascular congestion and suspected  mild interstitial edema.  Mild patchy left basilar opacity, atelectasis versus pneumonia.   Original Report Authenticated By: Charline Bills, M.D.          Subjective: Patient is pleasantly confused, but she complains of chest discomfort this morning. She denies any shortness of breath, abdominal pain, headache, vomiting, diarrhea. No reports of respiratory distress.  Objective: Filed Vitals:   02/03/13 0600 02/03/13 0627 02/03/13 0633 02/03/13 0800  BP:  75/34 85/54   Pulse: 60  63   Temp:    98.4 F (36.9 C)  TempSrc:    Oral  Resp: 18  15   Height:      Weight:      SpO2: 97%  88%     Intake/Output Summary (Last 24 hours) at 02/03/13 0818 Last data filed at 02/03/13 0600  Gross per 24 hour  Intake   1435 ml  Output   1030 ml  Net    405 ml   Weight change:  Exam:   General:  Pt is alert, follows commands appropriately, not in acute distress  HEENT: No icterus, No thrush,  [place]inger/AT  Cardiovascular: RRR, S1/S2, no rubs, no gallops  Respiratory: Bibasilar crackles, right greater than left. No wheezes or rhonchi. Good air movement  Abdomen: Soft/+BS, non tender, non distended, no guarding  Extremities: trace edema right lower extremity, No lymphangitis, No petechiae, No rashes, no synovitis; healing abrasion right pretibial area; presacral area without any open wounds  Data Reviewed: Basic Metabolic Panel:  Recent Labs Lab 02/02/13 1644 02/03/13 0005 02/03/13 0330  NA 134* 134* 136  K 6.7* 4.2 4.0  CL 99 93* 96  CO2 20 28 27   GLUCOSE 88 144* 111*  BUN 59* 59* 58*  CREATININE 1.52* 1.42* 1.33*  CALCIUM 8.3* 8.3* 7.9*  MG 2.0 2.0  --   PHOS  --  6.9*  --    Liver  Function Tests:  Recent Labs Lab 02/02/13 1644 02/03/13 0005 02/03/13 0330  AST 112* 142* 130*  ALT 94* 120* 118*  ALKPHOS 99 101 96  BILITOT 0.5 0.3 0.2*  PROT 5.3* 5.5* 5.2*  ALBUMIN 2.7* 2.9* 2.7*   No results found for this basename: LIPASE, AMYLASE,  in the last 168 hours No  results found for this basename: AMMONIA,  in the last 168 hours CBC:  Recent Labs Lab 02/02/13 1644 02/03/13 0005 02/03/13 0330  WBC 11.9* 10.1 9.9  NEUTROABS 9.5* 8.2*  --   HGB 8.8* 8.6* 8.4*  HCT 30.8* 30.2* 29.6*  MCV 78.6 78.0 77.7*  PLT 352 296 271   Cardiac Enzymes:  Recent Labs Lab 02/02/13 1644  TROPONINI <0.30   BNP: No components found with this basename: POCBNP,  CBG:  Recent Labs Lab 02/02/13 1803 02/03/13 0734  GLUCAP 87 89    Recent Results (from the past 240 hour(s))  MRSA PCR SCREENING     Status: None   Collection Time    02/02/13  8:54 PM      Result Value Range Status   MRSA by PCR NEGATIVE  NEGATIVE Final   Comment:            The GeneXpert MRSA Assay (FDA     approved for NASAL specimens     only), is one component of a     comprehensive MRSA colonization     surveillance program. It is not     intended to diagnose MRSA     infection nor to guide or     monitor treatment for     MRSA infections.     Scheduled Meds: . [START ON 02/04/2013] antiseptic oral rinse  15 mL Mouth Rinse BID  . aspirin  81 mg Oral Daily  . calcium-vitamin D  1 tablet Oral Daily  . cefTRIAXone (ROCEPHIN)  IV  1 g Intravenous Q24H  . dextrose  1 ampule Intravenous Once  . docusate sodium  100 mg Oral BID  . enoxaparin (LOVENOX) injection  30 mg Subcutaneous Q24H  . escitalopram  10 mg Oral Daily  . gabapentin  600 mg Oral TID  . guaiFENesin  600 mg Oral BID  . insulin aspart  10 Units Intravenous Once  . mirtazapine  7.5 mg Oral QHS  . multivitamin with minerals  1 tablet Oral Daily  . polyvinyl alcohol  1 drop Both Eyes BID  . sodium chloride  250 mL Intravenous Once  . sodium chloride  3 mL Intravenous Q12H   Continuous Infusions: . sodium chloride       Tayvon Culley, DO  [facility] Pager [phone]  If 7PM-7AM, please contact night-coverage [email or web address] Password [email or web address] 02/03/2013, 8:18 AM   LOS: 1 day

## 2013-02-03 NOTE — Progress Notes (Signed)
*  PRELIMINARY RESULTS* Echocardiogram 2D Echocardiogram has been performed.  Carrie [name] 02/03/2013, 10:42 AM

## 2013-02-03 NOTE — Progress Notes (Signed)
SLP Cancellation Note  Patient Details Name: Carrie [name] MRN: [id number] DOB: 10-19-1925   Cancelled treatment:        Attempted to see patient for Bedside Swallow Evaluation.  Patient is currently off the unit for a procedure.  Will return later today if schedule allows.   [name] T 02/03/2013, 11:31 AM

## 2013-02-03 NOTE — Progress Notes (Signed)
Referring Physician: Dr. Arbutus Leas    Chief Complaint: weakness  HPI: Carrie [name] is an 79 y.o. female who lives at SNF and was brought in on 02/02/2009 with weakness and bradycardia. She was hyperkalemic and corrected for this. Also, she was hypoxic and O2 administered. SBP has been running 80s-120s. She is anemic with HCT @ 30.8. She has moved from [place] to [place] to be near her sister. Both her sister and the nurse from her SNF state that she appears much weaker in her arms and that she keeps "bobbing her head" and that this is not normal. She takes remeron at bedtime, but this is not new. She does have some level of dementia at baseline. No family at bedside. Pt on abx for UTI. Cx is pending.  Date last known well: 02/02/2013 Time last known well: Unable to determine  tPA Given: No: patient found in NH to be weak and bradycardiac, unknown timeframe.  PMH shows dysphagia due to CVD. Unclear if this is for now or previous incident.  Past Medical History  Diagnosis Date  . CHF (congestive heart failure)   . 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 Esophageal reflux   . Peripheral vascular disease   . Altered mental status   . Osteoarthrosis   . Hypertension   . Depressive disorder     History reviewed. No pertinent past surgical history.  No family history on file. Social History:  reports that she has quit smoking. She does not have any smokeless tobacco history on file. Her alcohol and drug histories are not on file.  Allergies: No Known Allergies  Current Facility-Administered Medications  Medication Dose Route Frequency Kyndle Schlender Last Rate Last Dose  . 0.9 %  sodium chloride infusion   Intravenous Continuous Alison Murray, MD      . acetaminophen (TYLENOL) tablet 650 mg  650 mg Oral Q6H PRN Alison Murray, MD       Or  . acetaminophen (TYLENOL) suppository 650 mg  650 mg Rectal Q6H PRN Alison Murray, MD      . albuterol (PROVENTIL) (5 MG/ML) 0.5% nebulizer solution 2.5  mg  2.5 mg Nebulization Q2H PRN Alison Murray, MD      . Melene Muller ON 02/04/2013] antiseptic oral rinse (BIOTENE) solution 15 mL  15 mL Mouth Rinse BID Alison Murray, MD      . aspirin chewable tablet 81 mg  81 mg Oral Daily Alison Murray, MD   81 mg at 02/03/13 1227  . calcium-vitamin D (OSCAL WITH D) 500-200 MG-UNIT per tablet 1 tablet  1 tablet Oral Daily Alison Murray, MD   1 tablet at 02/03/13 1228  . dextrose 50 % solution 50 mL  1 ampule Intravenous Once Glynn Octave, MD      . docusate sodium (COLACE) capsule 100 mg  100 mg Oral BID Alison Murray, MD   100 mg at 02/03/13 1229  . enoxaparin (LOVENOX) injection 30 mg  30 mg Subcutaneous Q24H Alison Murray, MD   30 mg at 02/02/13 2204  . escitalopram (LEXAPRO) tablet 10 mg  10 mg Oral Daily Alison Murray, MD   10 mg at 02/03/13 1225  . gabapentin (NEURONTIN) capsule 600 mg  600 mg Oral TID Alison Murray, MD   600 mg at 02/03/13 1227  . guaiFENesin (MUCINEX) 12 hr tablet 600 mg  600 mg Oral BID Alison Murray, MD   600 mg at 02/03/13 1229  .  insulin aspart (novoLOG) injection 10 Units  10 Units Intravenous Once Glynn Octave, MD      . ipratropium (ATROVENT) nebulizer solution 0.5 mg  0.5 mg Nebulization Q2H PRN Alison Murray, MD      . mirtazapine (REMERON) tablet 7.5 mg  7.5 mg Oral QHS Alison Murray, MD   7.5 mg at 02/02/13 2204  . multivitamin with minerals tablet 1 tablet  1 tablet Oral Daily Alison Murray, MD   1 tablet at 02/03/13 1229  . ondansetron (ZOFRAN) tablet 4 mg  4 mg Oral Q6H PRN Alison Murray, MD       Or  . ondansetron [facility]) injection 4 mg  4 mg Intravenous Q6H PRN Alison Murray, MD      . piperacillin-tazobactam (ZOSYN) IVPB 3.375 g  3.375 g Intravenous Q8H Catarina Hartshorn, MD   3.375 g at 02/03/13 1217  . polyvinyl alcohol (LIQUIFILM TEARS) 1.4 % ophthalmic solution 1 drop  1 drop Both Eyes BID Alison Murray, MD   1 drop at 02/03/13 1231  . sodium chloride 0.9 % injection 3 mL  3 mL Intravenous Q12H Alison Murray, MD   3 mL  at 02/03/13 1230    ROS: History obtained from chart review and unobtainable from patient due to mental status  General ROS: negative for - chills, fatigue, fever, night sweats, weight gain or weight loss Psychological ROS: negative for - behavioral disorder, hallucinations, mood swings or suicidal ideation, ++ memory difficulties Ophthalmic ROS: negative for - blurry vision, double vision, eye pain or loss of vision ENT ROS: negative for - epistaxis, nasal discharge, oral lesions, sore throat, tinnitus or vertigo Allergy and Immunology ROS: negative for - hives or itchy/watery eyes Hematological and Lymphatic ROS: negative for - bleeding problems, bruising or swollen lymph nodes Endocrine ROS: negative for - galactorrhea, hair pattern changes, polydipsia/polyuria or temperature intolerance Respiratory ROS: negative for - cough, hemoptysis, shortness of breath or wheezing Cardiovascular ROS: negative for - chest pain, dyspnea on exertion, edema or irregular heartbeat Gastrointestinal ROS: negative for - abdominal pain, diarrhea, hematemesis, nausea/vomiting or stool incontinence Genito-Urinary ROS: negative for - dysuria, hematuria, incontinence or urinary frequency/urgency Musculoskeletal ROS: negative for - joint swelling or muscular weakness Neurological ROS: as noted in HPI Dermatological ROS: negative for rash and skin lesion changes   Physical Examination: Blood pressure 82/38, pulse 62, temperature 98.4 F (36.9 C), temperature source Oral, resp. rate 20, height 5' (1.524 m), weight 38.1 kg (83 lb 15.9 oz), SpO2 98.00%.  Neurologic Examination:  Mental Status: Alert. She knows her age, not the month, but knows that she is in the hospital. 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 Speech fluent without evidence of aphasia.  Able to follow 1 step commands without difficulty. Cranial  Nerves: II: visual fields grossly normal, pupils equal, round, reactive to light and accommodation III,IV, VI: ptosis not present, extra-ocular motions intact bilaterally V,VII: smile symmetric, facial light touch sensation normal bilaterally VIII: hard of hearing IX,X: gag reflex present XI: trapezius strength/neck flexion strength normal bilaterally XII: tongue strength normal  Motor: Right : Upper extremity   5/5    Left:     Upper extremity   5/5  Lower extremity   5/5     Lower extremity   5/5 Tone and bulk:normal tone throughout; no atrophy noted Sensory: Pinprick  and light touch intact throughout, bilaterally Deep Tendon Reflexes: 2+ and symmetric throughout Plantars: equivocal Cerebellar:  Slowed normal finger-to-nose. Unable to ambulate due to safety concerns   Results for orders placed during the hospital encounter of 02/02/13 (from the past 48 hour(s))  PRO B NATRIURETIC PEPTIDE     Status: Abnormal   Collection Time    02/02/13  4:19 PM      Result Value Range   Pro B Natriuretic peptide (BNP) 22537.0 (*) 0 - 450 pg/mL  CBC WITH DIFFERENTIAL     Status: Abnormal   Collection Time    02/02/13  4:44 PM      Result Value Range   WBC 11.9 (*) 4.0 - 10.5 K/uL   RBC 3.92  3.87 - 5.11 MIL/uL   Hemoglobin 8.8 (*) 12.0 - 15.0 g/dL   HCT 16.1 (*) 09.6 - 04.5 %   MCV 78.6  78.0 - 100.0 fL   MCH 22.4 (*) 26.0 - 34.0 pg   MCHC 28.6 (*) 30.0 - 36.0 g/dL   RDW 40.9 (*) 81.1 - 91.4 %   Platelets 352  150 - 400 K/uL   Neutrophils Relative 80 (*) 43 - 77 %   Neutro Abs 9.5 (*) 1.7 - 7.7 K/uL   Lymphocytes Relative 9 (*) 12 - 46 %   Lymphs Abs 1.1  0.7 - 4.0 K/uL   Monocytes Relative 11  3 - 12 %   Monocytes Absolute 1.3 (*) 0.1 - 1.0 K/uL   Eosinophils Relative 0  0 - 5 %   Eosinophils Absolute 0.0  0.0 - 0.7 K/uL   Basophils Relative 0  0 - 1 %   Basophils Absolute 0.0  0.0 - 0.1 K/uL  COMPREHENSIVE METABOLIC PANEL     Status: Abnormal   Collection Time    02/02/13  4:44  PM      Result Value Range   Sodium 134 (*) 135 - 145 mEq/L   Potassium 6.7 (*) 3.5 - 5.1 mEq/L   Comment: NO VISIBLE HEMOLYSIS     CRITICAL RESULT CALLED TO, READ BACK BY AND VERIFIED WITH:     CARRIE HALL,RN [id number] @ 1751 BY J SCOTTON   Chloride 99  96 - 112 mEq/L   CO2 20  19 - 32 mEq/L   Glucose, Bld 88  70 - 99 mg/dL   BUN 59 (*) 6 - 23 mg/dL   Creatinine, Ser 2.13 (*) 0.50 - 1.10 mg/dL   Calcium 8.3 (*) 8.4 - 10.5 mg/dL   Total Protein 5.3 (*) 6.0 - 8.3 g/dL   Albumin 2.7 (*) 3.5 - 5.2 g/dL   AST 086 (*) 0 - 37 U/L   ALT 94 (*) 0 - 35 U/L   Alkaline Phosphatase 99  39 - 117 U/L   Total Bilirubin 0.5  0.3 - 1.2 mg/dL   GFR calc non Af Amer 30 (*) >90 mL/min   GFR calc Af Amer 35 (*) >90 mL/min   Comment:            The eGFR has been calculated     using the CKD EPI equation.     This calculation has not been     validated in all clinical     situations.     eGFR's persistently     <90 mL/min signify     possible Chronic Kidney Disease.  TROPONIN I     Status: None   Collection Time    02/02/13  4:44 PM      Result Value Range   Troponin I <0.30  <0.30 ng/mL   Comment:            Due to the release kinetics of cTnI,     a negative result within the first hours     of the onset of symptoms does not rule out     myocardial infarction with certainty.     If myocardial infarction is still suspected,     repeat the test at appropriate intervals.  PROTIME-INR     Status: Abnormal   Collection Time    02/02/13  4:44 PM      Result Value Range   Prothrombin Time 19.3 (*) 11.6 - 15.2 seconds   INR 1.69 (*) 0.00 - 1.49  MAGNESIUM     Status: None   Collection Time    02/02/13  4:44 PM      Result Value Range   Magnesium 2.0  1.5 - 2.5 mg/dL  URINALYSIS, ROUTINE W REFLEX MICROSCOPIC     Status: Abnormal   Collection Time    02/02/13  5:11 PM      Result Value Range   Color, Urine YELLOW  YELLOW   APPearance CLOUDY (*) CLEAR   Specific Gravity, Urine 1.022  1.005 -  1.030   pH 5.0  5.0 - 8.0   Glucose, UA NEGATIVE  NEGATIVE mg/dL   Hgb urine dipstick MODERATE (*) NEGATIVE   Bilirubin Urine MODERATE (*) NEGATIVE   Ketones, ur NEGATIVE  NEGATIVE mg/dL   Protein, ur 30 (*) NEGATIVE mg/dL   Urobilinogen, UA 1.0  0.0 - 1.0 mg/dL   Nitrite NEGATIVE  NEGATIVE   Leukocytes, UA SMALL (*) NEGATIVE  URINE MICROSCOPIC-ADD ON     Status: Abnormal   Collection Time    02/02/13  5:11 PM      Result Value Range   Squamous Epithelial / LPF RARE  RARE   WBC, UA 0-2  <3 WBC/hpf   RBC / HPF 0-2  <3 RBC/hpf   Bacteria, UA MANY (*) RARE   Casts HYALINE CASTS (*) NEGATIVE   Urine-Other AMORPHOUS URATES/PHOSPHATES    CULTURE, BLOOD (ROUTINE X 2)     Status: None   Collection Time    02/02/13  5:50 PM      Result Value Range   Specimen Description BLOOD RIGHT HAND     Special Requests BOTTLES DRAWN AEROBIC AND ANAEROBIC 2.5CC EACH     Culture  Setup Time 02/02/2013 23:28     Culture       Value:        BLOOD CULTURE RECEIVED NO GROWTH TO DATE CULTURE WILL BE HELD FOR 5 DAYS BEFORE ISSUING A FINAL NEGATIVE REPORT   Report Status PENDING    GLUCOSE, CAPILLARY     Status: None   Collection Time    02/02/13  6:03 PM      Result Value Range   Glucose-Capillary 87  70 - 99 mg/dL  CULTURE, BLOOD (ROUTINE X 2)     Status: None   Collection Time    02/02/13  6:42 PM      Result Value Range   Specimen Description BLOOD RIGHT ARM     Special Requests BOTTLES DRAWN AEROBIC AND ANAEROBIC 4CC EACH     Culture  Setup Time 02/02/2013 23:28     Culture       Value:        BLOOD CULTURE RECEIVED  NO GROWTH TO DATE CULTURE WILL BE HELD FOR 5 DAYS BEFORE ISSUING A FINAL NEGATIVE REPORT   Report Status PENDING    PROCALCITONIN     Status: None   Collection Time    02/02/13  6:45 PM      Result Value Range   Procalcitonin 0.10     Comment:            Interpretation:     PCT (Procalcitonin) <= 0.5 ng/mL:     Systemic infection (sepsis) is not likely.     Local bacterial  infection is possible.     (NOTE)             ICU PCT Algorithm               Non ICU PCT Algorithm        ----------------------------     ------------------------------             PCT < 0.25 ng/mL                 PCT < 0.1 ng/mL         Stopping of antibiotics            Stopping of antibiotics           strongly encouraged.               strongly encouraged.        ----------------------------     ------------------------------           PCT level decrease by               PCT < 0.25 ng/mL           >= 80% from peak PCT           OR PCT 0.25 - 0.5 ng/mL          Stopping of antibiotics                                                 encouraged.         Stopping of antibiotics               encouraged.        ----------------------------     ------------------------------           PCT level decrease by              PCT >= 0.25 ng/mL           < 80% from peak PCT            AND PCT >= 0.5 ng/mL            Continuing antibiotics                                                  encouraged.           Continuing antibiotics                encouraged.        ----------------------------     ------------------------------         PCT level increase compared  PCT > 0.5 ng/mL             with peak PCT AND              PCT >= 0.5 ng/mL             Escalation of antibiotics                                              strongly encouraged.          Escalation of antibiotics            strongly encouraged.  URINALYSIS, ROUTINE W REFLEX MICROSCOPIC     Status: Abnormal   Collection Time    02/02/13  8:54 PM      Result Value Range   Color, Urine YELLOW  YELLOW   APPearance CLOUDY (*) CLEAR   Specific Gravity, Urine 1.020  1.005 - 1.030   pH 5.0  5.0 - 8.0   Glucose, UA NEGATIVE  NEGATIVE mg/dL   Hgb urine dipstick SMALL (*) NEGATIVE   Bilirubin Urine SMALL (*) NEGATIVE   Ketones, ur NEGATIVE  NEGATIVE mg/dL   Protein, ur NEGATIVE  NEGATIVE mg/dL   Urobilinogen, UA 1.0  0.0 -  1.0 mg/dL   Nitrite NEGATIVE  NEGATIVE   Leukocytes, UA MODERATE (*) NEGATIVE  MRSA PCR SCREENING     Status: None   Collection Time    02/02/13  8:54 PM      Result Value Range   MRSA by PCR NEGATIVE  NEGATIVE   Comment:            The GeneXpert MRSA Assay (FDA     approved for NASAL specimens     only), is one component of a     comprehensive MRSA colonization     surveillance program. It is not     intended to diagnose MRSA     infection nor to guide or     monitor treatment for     MRSA infections.  URINE MICROSCOPIC-ADD ON     Status: Abnormal   Collection Time    02/02/13  8:54 PM      Result Value Range   Squamous Epithelial / LPF RARE  RARE   WBC, UA 0-2  <3 WBC/hpf   RBC / HPF 0-2  <3 RBC/hpf   Bacteria, UA MANY (*) RARE   Casts HYALINE CASTS (*) NEGATIVE  COMPREHENSIVE METABOLIC PANEL     Status: Abnormal   Collection Time    02/03/13 12:05 AM      Result Value Range   Sodium 134 (*) 135 - 145 mEq/L   Potassium 4.2  3.5 - 5.1 mEq/L   Comment: DELTA CHECK NOTED   Chloride 93 (*) 96 - 112 mEq/L   CO2 28  19 - 32 mEq/L   Glucose, Bld 144 (*) 70 - 99 mg/dL   BUN 59 (*) 6 - 23 mg/dL   Creatinine, Ser 5.78 (*) 0.50 - 1.10 mg/dL   Calcium 8.3 (*) 8.4 - 10.5 mg/dL   Total Protein 5.5 (*) 6.0 - 8.3 g/dL   Albumin 2.9 (*) 3.5 - 5.2 g/dL   AST 469 (*) 0 - 37 U/L   ALT 120 (*) 0 - 35 U/L   Alkaline Phosphatase 101  39 - 117 U/L   Total Bilirubin 0.3  0.3 -  1.2 mg/dL   GFR calc non Af Amer 33 (*) >90 mL/min   GFR calc Af Amer 38 (*) >90 mL/min   Comment:            The eGFR has been calculated     using the CKD EPI equation.     This calculation has not been     validated in all clinical     situations.     eGFR's persistently     <90 mL/min signify     possible Chronic Kidney Disease.  MAGNESIUM     Status: None   Collection Time    02/03/13 12:05 AM      Result Value Range   Magnesium 2.0  1.5 - 2.5 mg/dL  PHOSPHORUS     Status: Abnormal   Collection Time     02/03/13 12:05 AM      Result Value Range   Phosphorus 6.9 (*) 2.3 - 4.6 mg/dL  CBC WITH DIFFERENTIAL     Status: Abnormal   Collection Time    02/03/13 12:05 AM      Result Value Range   WBC 10.1  4.0 - 10.5 K/uL   RBC 3.87  3.87 - 5.11 MIL/uL   Hemoglobin 8.6 (*) 12.0 - 15.0 g/dL   HCT 16.1 (*) 09.6 - 04.5 %   MCV 78.0  78.0 - 100.0 fL   MCH 22.2 (*) 26.0 - 34.0 pg   MCHC 28.5 (*) 30.0 - 36.0 g/dL   RDW 40.9 (*) 81.1 - 91.4 %   Platelets 296  150 - 400 K/uL   Neutrophils Relative 82 (*) 43 - 77 %   Neutro Abs 8.2 (*) 1.7 - 7.7 K/uL   Lymphocytes Relative 6 (*) 12 - 46 %   Lymphs Abs 0.6 (*) 0.7 - 4.0 K/uL   Monocytes Relative 13 (*) 3 - 12 %   Monocytes Absolute 1.3 (*) 0.1 - 1.0 K/uL   Eosinophils Relative 0  0 - 5 %   Eosinophils Absolute 0.0  0.0 - 0.7 K/uL   Basophils Relative 0  0 - 1 %   Basophils Absolute 0.0  0.0 - 0.1 K/uL  APTT     Status: Abnormal   Collection Time    02/03/13 12:05 AM      Result Value Range   aPTT 39 (*) 24 - 37 seconds   Comment:            IF BASELINE aPTT IS ELEVATED,     SUGGEST PATIENT RISK ASSESSMENT     BE USED TO DETERMINE APPROPRIATE     ANTICOAGULANT THERAPY.  PROTIME-INR     Status: Abnormal   Collection Time    02/03/13 12:05 AM      Result Value Range   Prothrombin Time 19.2 (*) 11.6 - 15.2 seconds   INR 1.68 (*) 0.00 - 1.49  TSH     Status: None   Collection Time    02/03/13 12:05 AM      Result Value Range   TSH 3.618  0.350 - 4.500 uIU/mL  COMPREHENSIVE METABOLIC PANEL     Status: Abnormal   Collection Time    02/03/13  3:30 AM      Result Value Range   Sodium 136  135 - 145 mEq/L   Potassium 4.0  3.5 - 5.1 mEq/L   Chloride 96  96 - 112 mEq/L   CO2 27  19 - 32 mEq/L   Glucose, Bld 111 (*)  70 - 99 mg/dL   BUN 58 (*) 6 - 23 mg/dL   Creatinine, Ser 1.61 (*) 0.50 - 1.10 mg/dL   Calcium 7.9 (*) 8.4 - 10.5 mg/dL   Total Protein 5.2 (*) 6.0 - 8.3 g/dL   Albumin 2.7 (*) 3.5 - 5.2 g/dL   AST 096 (*) 0 - 37 U/L    ALT 118 (*) 0 - 35 U/L   Alkaline Phosphatase 96  39 - 117 U/L   Total Bilirubin 0.2 (*) 0.3 - 1.2 mg/dL   GFR calc non Af Amer 35 (*) >90 mL/min   GFR calc Af Amer 41 (*) >90 mL/min   Comment:            The eGFR has been calculated     using the CKD EPI equation.     This calculation has not been     validated in all clinical     situations.     eGFR's persistently     <90 mL/min signify     possible Chronic Kidney Disease.  CBC     Status: Abnormal   Collection Time    02/03/13  3:30 AM      Result Value Range   WBC 9.9  4.0 - 10.5 K/uL   RBC 3.81 (*) 3.87 - 5.11 MIL/uL   Hemoglobin 8.4 (*) 12.0 - 15.0 g/dL   HCT 04.5 (*) 40.9 - 81.1 %   MCV 77.7 (*) 78.0 - 100.0 fL   MCH 22.0 (*) 26.0 - 34.0 pg   MCHC 28.4 (*) 30.0 - 36.0 g/dL   RDW 91.4 (*) 78.2 - 95.6 %   Platelets 271  150 - 400 K/uL  GLUCOSE, CAPILLARY     Status: None   Collection Time    02/03/13  7:34 AM      Result Value Range   Glucose-Capillary 89  70 - 99 mg/dL  TROPONIN I     Status: None   Collection Time    02/03/13  9:25 AM      Result Value Range   Troponin I <0.30  <0.30 ng/mL   Comment:            Due to the release kinetics of cTnI,     a negative result within the first hours     of the onset of symptoms does not rule out     myocardial infarction with certainty.     If myocardial infarction is still suspected,     repeat the test at appropriate intervals.  TROPONIN I     Status: None   Collection Time    02/03/13  2:22 PM      Result Value Range   Troponin I <0.30  <0.30 ng/mL   Comment:            Due to the release kinetics of cTnI,     a negative result within the first hours     of the onset of symptoms does not rule out     myocardial infarction with certainty.     If myocardial infarction is still suspected,     repeat the test at appropriate intervals.  CK     Status: Abnormal   Collection Time    02/03/13  2:23 PM      Result Value Range   Total CK 271 (*) 7 - 177 U/L   US  Abdomen Complete  02/03/2013  *RADIOLOGY REPORT*  Clinical Data:  Acute renal failure.  Elevated LFTs.  History of CHF, hypertension.  COMPLETE ABDOMINAL ULTRASOUND  Comparison:  CT of the abdomen and pelvis on 09/05/2009  Findings:  Gallbladder:  Gallbladder wall is thickened, 3.7 mm.  No gallstones or pericholecystic fluid identified.  No sonographic Murphy's sign.  Common bile duct:  4.6 mm, within normal limits.  Liver:  There is limited visualization of the lower.  Visualized aspect shows no focal lesion.  IVC:  Appears normal.  Pancreas:  There is limited visualization of the pancreas because of bowel gas.  Spleen:  The visualized portion of the spleen is normal in appearance, 5.3 cm in length.  Right Kidney:  There is parenchymal thinning of the right kidney. Length is 8.7 cm.  No focal mass or hydronephrosis.  Left Kidney:  9.7 cm in length.  No focal mass or hydronephrosis.  Abdominal aorta:  Abdominal aorta is 1.8 cm in diameter.  Additional findings:  Ascites and right pleural effusion are noted.  IMPRESSION:  1.  Thickened gallbladder wall which can be a nonspecific finding, often associated with ascites. 2.  No other evidence for acute cholecystitis. 3.  Limited evaluation of the liver and pancreas. 4.  Parenchymal thinning of the right kidney is felt to be a chronic abnormality, but likely new since exam in 2010. Consider CT of the abdomen pelvis without contrast to evaluate for possible obstruction.   Original Report Authenticated By: Norva Pavlov, M.D.    Dg Chest Port 1 View  02/03/2013  *RADIOLOGY REPORT*  Clinical Data: Pulmonary infiltrates.  PORTABLE CHEST - 1 VIEW  Comparison: 02/02/2013 and 01/15/2010  Findings: The heart size and pulmonary vascularity are normal. There is minimal atelectasis in the right mid and lower lung zone and at the left base medially.  No consolidative infiltrates or effusions.  No acute osseous abnormality.  IMPRESSION: Slight atelectasis.   Original Report  Authenticated By: Francene Boyers, M.D.    Dg Chest Portable 1 View  02/02/2013  *RADIOLOGY REPORT*  Clinical Data: Bradycardia, history of CHF  PORTABLE CHEST - 1 VIEW  Comparison: 01/15/2010  Findings: Cardiomegaly with pulmonary vascular congestion and suspected mild interstitial edema.  Mild patchy left basilar opacity, atelectasis versus pneumonia.  Underlying chronic interstitial markings.  No pneumothorax.  Left lung base is obscured by defibrillator pads.  IMPRESSION: Cardiomegaly with pulmonary vascular congestion and suspected mild interstitial edema.  Mild patchy left basilar opacity, atelectasis versus pneumonia.   Original Report Authenticated By: Charline Bills, M.D.     Assessment: 79 y.o. female initially sent to hospital from SNF with weakness and found to be hyperkalemic, hypoxic, hypotensive and anemic. She does have underlying dementia, unclear to what extent; however she does reside in SNF residence. Her symptoms include weakness of arms to where she is too weak to hold cups. She does appear to drop head from time to time as if she is sleepy, but does not fall asleep. She does have a sepsis workup pending. She is proceeding to the CT scanner at this point. With her acute metabolic disorders, this may also exacerbate her level of dementia. Workup pending.  Stroke Risk Factors - hypertension and peripheral vascular disease  Plan: 1. HgbA1c, fasting lipid panel 2. CT Brain, will review and decide if MR needed 3. PT consult, OT consult, Speech consult 4. Echocardiogram 5. Carotid dopplers 6. Prophylactic therapy-Antiplatelet med: Aspirin - dose 81mg  7. Risk factor modification 8. Telemetry monitoring 9. Frequent neuro checks   Guy Franco PA-C, MBA, MHA [facility] Pager  [phone]  I personally participate in this patient's care and formulated the above clinical assessment and management recommendations.  Venetia Maxon M.D. [facility] [phone] 02/03/2013, 5:05 PM

## 2013-02-03 NOTE — Progress Notes (Signed)
INITIAL NUTRITION ASSESSMENT  DOCUMENTATION CODES Per approved criteria  -Underweight   INTERVENTION: Provide Magic cup BID  NUTRITION DIAGNOSIS: Inadequate oral intake related to poor appetite as evidenced by low BMI of 16.4.   Goal: Pt to meet >/= 90% of their estimated nutrition needs  Monitor:  SLP recommendations PO intake Wt  Reason for Assessment: Low BMI  79 y.o. female  Admitting Dx: Acute respiratory failure with hypoxia  ASSESSMENT: 79 year old female with past medical history of dementia, depression who was transferred from [facility] 02/02/2013 due to low heart rate (48) and oxygen saturation of 88%. There was no reports of shortness of breat or fevers in nursing home. Patient is not a good historian due to dementia. Per ED physician, nursing home staff reported patient had complaints of abdominal pain but there was no report of vomiting. Pt reports that she has a small appetite and was eating very little PTA as usual. Per pt's sister pt maintains her weight around 83 lbs. Pt reports liking Ensure. Per SLP recommendations pt needs dysphagia 2 diet with honey-thick liquids; pt will be re-evaluated  Height: Ht Readings from Last 1 Encounters:  02/02/13 5' (1.524 m)    Weight: Wt Readings from Last 1 Encounters:  02/02/13 83 lb 15.9 oz (38.1 kg)    Ideal Body Weight: 100 lbs  % Ideal Body Weight: 83%  Wt Readings from Last 10 Encounters:  02/02/13 83 lb 15.9 oz (38.1 kg)    Usual Body Weight: 83 lbs  % Usual Body Weight: 100%  BMI:  Body mass index is 16.4 kg/(m^2).  Estimated Nutritional Needs: Kcal: 8413-2440 Protein: 38-46 grams Fluid: >1.1 L  Skin: LLE non-pitting edema and abrasion on left leg  Diet Order: Dysphagia  EDUCATION NEEDS: -No education needs identified at this time   Intake/Output Summary (Last 24 hours) at 02/03/13 1611 Last data filed at 02/03/13 1400  Gross per 24 hour  Intake   1735 ml  Output   1505 ml  Net    230 ml     Last BM: 3/10  Labs:   Recent Labs Lab 02/02/13 1644 02/03/13 0005 02/03/13 0330  NA 134* 134* 136  K 6.7* 4.2 4.0  CL 99 93* 96  CO2 20 28 27   BUN 59* 59* 58*  CREATININE 1.52* 1.42* 1.33*  CALCIUM 8.3* 8.3* 7.9*  MG 2.0 2.0  --   PHOS  --  6.9*  --   GLUCOSE 88 144* 111*    CBG (last 3)   Recent Labs  02/02/13 1803 02/03/13 0734  GLUCAP 87 89    Scheduled Meds: . [START ON 02/04/2013] antiseptic oral rinse  15 mL Mouth Rinse BID  . aspirin  81 mg Oral Daily  . calcium-vitamin D  1 tablet Oral Daily  . dextrose  1 ampule Intravenous Once  . docusate sodium  100 mg Oral BID  . enoxaparin (LOVENOX) injection  30 mg Subcutaneous Q24H  . escitalopram  10 mg Oral Daily  . gabapentin  600 mg Oral TID  . guaiFENesin  600 mg Oral BID  . insulin aspart  10 Units Intravenous Once  . mirtazapine  7.5 mg Oral QHS  . multivitamin with minerals  1 tablet Oral Daily  . piperacillin-tazobactam  3.375 g Intravenous Q8H  . polyvinyl alcohol  1 drop Both Eyes BID  . sodium chloride  3 mL Intravenous Q12H    Continuous Infusions: . sodium chloride      Past Medical  History  Diagnosis Date  . CHF (congestive heart failure)   . 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 Esophageal reflux   . Peripheral vascular disease   . Altered mental status   . Osteoarthrosis   . Hypertension   . Depressive disorder     History reviewed. No pertinent past surgical history.  Ian Malkin RD, LDN Inpatient Clinical Dietitian Pager: [phone] After Hours Pager: [phone]

## 2013-02-03 NOTE — Progress Notes (Addendum)
Pt sister Jaci Carrel reports she saw Pt on Friday at [facility]. Pt was feeding herself and playing bingo. Has never seen Pt's head drooping or inability to hold cup. Says Pt doesn't usually perserverate on one topic. Geanie Berlin RN, liaison from [facility] came to check on pt and reports that pt usually transfers to wheelchair and moves herself with her foot in chair throughout facility.

## 2013-02-03 NOTE — Progress Notes (Signed)
VASCULAR LAB PRELIMINARY  PRELIMINARY  PRELIMINARY  PRELIMINARY  Left lower extremity venous duplex completed.    Preliminary report:  Left:  No evidence of DVT, superficial thrombosis, or Baker's cyst.  CESTONE, HELENE, RVT 02/03/2013, 9:46 AM

## 2013-02-04 ENCOUNTER — Inpatient Hospital Stay (HOSPITAL_COMMUNITY): Payer: Medicare HMO

## 2013-02-04 DIAGNOSIS — R531 Weakness: Secondary | ICD-10-CM | POA: Diagnosis present

## 2013-02-04 DIAGNOSIS — R188 Other ascites: Secondary | ICD-10-CM | POA: Diagnosis present

## 2013-02-04 DIAGNOSIS — E875 Hyperkalemia: Secondary | ICD-10-CM

## 2013-02-04 DIAGNOSIS — I5033 Acute on chronic diastolic (congestive) heart failure: Secondary | ICD-10-CM | POA: Diagnosis present

## 2013-02-04 LAB — HEPATIC FUNCTION PANEL
ALT: 205 U/L — ABNORMAL HIGH (ref 0–35)
Alkaline Phosphatase: 93 U/L (ref 39–117)
Bilirubin, Direct: 0.2 mg/dL (ref 0.0–0.3)
Indirect Bilirubin: 0.1 mg/dL — ABNORMAL LOW (ref 0.3–0.9)
Total Protein: 5.5 g/dL — ABNORMAL LOW (ref 6.0–8.3)

## 2013-02-04 LAB — BASIC METABOLIC PANEL
CO2: 33 mEq/L — ABNORMAL HIGH (ref 19–32)
Calcium: 7.9 mg/dL — ABNORMAL LOW (ref 8.4–10.5)
Creatinine, Ser: 1.01 mg/dL (ref 0.50–1.10)
GFR calc non Af Amer: 49 mL/min — ABNORMAL LOW (ref >90)
Sodium: 139 mEq/L (ref 135–145)

## 2013-02-04 LAB — GLUCOSE, CAPILLARY: Glucose-Capillary: 119 mg/dL — ABNORMAL HIGH (ref 70–99)

## 2013-02-04 LAB — CBC
HCT: 31.6 % — ABNORMAL LOW (ref 36.0–46.0)
MCHC: 27.8 g/dL — ABNORMAL LOW (ref 30.0–36.0)
MCV: 78.2 fL (ref 78.0–100.0)
Platelets: 239 10*3/uL (ref 150–400)
RDW: 19.7 % — ABNORMAL HIGH (ref 11.5–15.5)
WBC: 7.4 10*3/uL (ref 4.0–10.5)

## 2013-02-04 LAB — MAGNESIUM: Magnesium: 2 mg/dL (ref 1.5–2.5)

## 2013-02-04 LAB — PROCALCITONIN: Procalcitonin: <0.1 ng/mL

## 2013-02-04 MED ORDER — FUROSEMIDE 40 MG PO TABS
40.0000 mg | ORAL_TABLET | Freq: Every day | ORAL | Status: DC
  Administered 2013-02-04 – 2013-02-06 (×3): 40 mg via ORAL
  Filled 2013-02-04 (×5): qty 1

## 2013-02-04 MED ORDER — STARCH (THICKENING) PO POWD: ORAL | Status: DC | PRN

## 2013-02-04 MED ORDER — RESOURCE THICKENUP CLEAR PO POWD
ORAL | Status: DC | PRN
  Filled 2013-02-04: qty 125

## 2013-02-04 NOTE — Progress Notes (Signed)
CARE MANAGEMENT NOTE 02/04/2013  Patient:  Carrie [name],Carrie [name]   Account Number:  [id number]  Date Initiated:  02/04/2013  Documentation initiated by:  DAVIS,RHONDA  Subjective/Objective Assessment:   snf patient with bradycardia and hypoxia,     Action/Plan:   snf  [facility]   Anticipated DC Date:  02/07/2013   Anticipated DC Plan:  SKILLED NURSING FACILITY  In-house referral  Clinical Social Worker      DC Planning Services  NA      [facility] Choice  NA   Choice offered to / List presented to:  NA   DME arranged  NA      DME agency  NA     HH arranged  NA      HH agency  NA   Status of service:  In process, will continue to follow Medicare Important Message given?  NA - LOS <3 / Initial given by admissions (If response is "NO", the following Medicare IM given date fields will be blank) Date Medicare IM given:   Date Additional Medicare IM given:    Discharge Disposition:    Per UR Regulation:  Reviewed for med. necessity/level of care/duration of stay  If discussed at Long Length of Stay Meetings, dates discussed:    Comments:  [id number]/Rhonda Earlene Plater, RN, BSN, CCM:  CHART REVIEWED AND UPDATED.  Next chart review due on [id number]. NO DISCHARGE NEEDS PRESENT AT THIS TIME. CASE MANAGEMENT [phone]

## 2013-02-04 NOTE — Progress Notes (Signed)
SPEECH PATHOLOGY  Educated patient and her sister to results of MBS (silent aspiration of thin liquids) and diet recommendations, thickening liquids, and use of cervical collar for improved head support.  Sister verbalized understanding and agrees with plan.

## 2013-02-04 NOTE — Progress Notes (Signed)
Speech Language Pathology Dysphagia Treatment Patient Details Name: Rosalyn Archambault MRN: [id number] DOB: 29-Aug-1925 Today's Date: 02/04/2013 Time: 1010-1030 SLP Time Calculation (min): 20 min  Assessment / Plan / Recommendation Clinical Impression  Patient with some improvement in head control with cervical collar in place.  Head continues to fall forward, but not as far forward, or with as much force.Collar will definitely be beneficial during the MBS to keep patient in the field.  MBS scheduled today at 11:00.    Diet Recommendation  Continue with Current Diet: Dysphagia 2 (fine chop);Honey-thick liquid    SLP Plan MBS;New goals to be determined pending objective testing   Pertinent Vitals/Pain N/A   Swallowing Goals  SLP Swallowing Goals Patient will consume recommended diet without observed clinical signs of aspiration with: Maximum assistance Patient will utilize recommended strategies during swallow to increase swallowing safety with: Maximum assistance  General Temperature Spikes Noted: No Behavior/Cognition: Alert;Cooperative;Confused;Distractible;Requires cueing;Decreased sustained attention Oral Cavity - Dentition: Dentures, top;Dentures, bottom Patient Positioning: Postural control interferes with function (Cervical collar in place and improves head position)  Oral Cavity - Oral Hygiene Does patient have any of the following "at risk" factors?: Other - dysphagia;Oxygen therapy - cannula, mask, simple oxygen devices Brush patient's teeth BID with toothbrush (using toothpaste with fluoride): Yes Patient is HIGH RISK - Oral Care Protocol followed (see row info): Yes Patient is AT RISK - Oral Care Protocol followed (see row info): Yes   Dysphagia Treatment Treatment focused on: Skilled observation of diet tolerance (Facilitation of positioning to keep head upright) Treatment Methods/Modalities: Skilled observation;Differential diagnosis Patient observed directly with PO's:  Yes Type of PO's observed: Dysphagia 2 (chopped);Honey-thick liquids (crushed meds with applesauce.) Feeding: Total assist Liquids provided via: Cup Pharyngeal Phase Signs & Symptoms: Suspected delayed swallow initiation Type of cueing: Verbal Amount of cueing: Moderate   GO     [name] T 02/04/2013, 10:42 AM

## 2013-02-04 NOTE — Clinical Social Work Psychosocial (Signed)
Clinical Social Work Department BRIEF PSYCHOSOCIAL ASSESSMENT 02/04/2013  Patient:  Carrie [name],Carrie [name]     Account Number:  [id number]     Admit date:  02/02/2013  Clinical Social Worker:  Jodelle Red  Date/Time:  02/04/2013 10:40 AM  Referred by:  CSW  Date Referred:  02/03/2013 Referred for  Other - See comment   Other Referral:   ADMITTED FROM SNF   Interview type:  Patient Other interview type:   FACILITY REP    PSYCHOSOCIAL DATA Living Status:  FACILITY Admitted from facility:  [facility] Level of care:  Skilled Nursing Facility Primary support name:  Hinda Lenis Primary support relationship to patient:  SIBLING Degree of support available:   GOOD FROM SISTER    CURRENT CONCERNS Current Concerns  Post-Acute Placement   Other Concerns:   PLANS TO RETURN TO SNF    SOCIAL WORK ASSESSMENT / PLAN CSW met briefly with Pt and spoke with facility rep. Pt is pvt pay at [facility] and plans to retun there on discharge. No other needs identified. CSW to assist with return to SNF.   Assessment/plan status:  Psychosocial Support/Ongoing Assessment of Needs Other assessment/ plan:   return to snf coordination   Information/referral to community resources:   updates sent to [facility]    PATIENT'S/FAMILY'S RESPONSE TO PLAN OF CARE: Plan is to return to [facility] SNF. Pt 's sister has completed a bed hold at the facility. CSW to follow for support and return to SNF.    Doreen Salvage, LCSW ICU/Stepdown Clinical Social Worker [facility] Cell [phone] Hours 8am-1200pm M-F

## 2013-02-04 NOTE — Procedures (Signed)
Objective Swallowing Evaluation: Modified Barium Swallowing Study  Patient Details  Name: Carrie [name] MRN: [id number] Date of Birth: March 03, 1925  Today's Date: 02/04/2013 Time: 1126-1205 SLP Time Calculation (min): 39 min  Past Medical History:  Past Medical History  Diagnosis Date  . CHF (congestive heart failure)   . 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 Esophageal reflux   . Peripheral vascular disease   . Altered mental status   . Osteoarthrosis   . Hypertension   . Depressive disorder    Past Surgical History: History reviewed. No pertinent past surgical history. HPI:  79 year old female with past medical history of dementia, depression who was transferred from [facility] 02/02/2013 due to low heart rate (48) and oxygen saturation of 88%. There was no reports of shortness of breat or fevers in nursing home. Patient is not a good historian due to dementia. Per ED physician, nursing home staff reported patient had complaints of abdominal pain but there was no report of vomiting. No falls or loss of consciousness.     Assessment / Plan / Recommendation Clinical Impression  Dysphagia Diagnosis: Moderate oral phase dysphagia;Moderate pharyngeal phase dysphagia;Moderate cervical esophageal phase dysphagia Clinical impression: Patient presents with a moderate oropharyngeal sensori-motor dysphagia, and well as what appears to be an esophageal dysphagia.  Patient silently aspirated thin liquids during the swallow due to reduced laryngeal elevation.  Mild residue was noted with most consistencies, but cleared with a repeat dry swallow.  In addition, patient appeared to have a prominent chricopharyngeus and somewhat dialated appearace to the distal esophagus, just under the UES.  There appeared to be reduced esophageal perestalsis, tertiatry contractions, and moderate stasis about the LES that slowly cleared.  No Radiologist was present to confirm these observations.  The patient was evaluated in  an upright position with a cervical collar in place to assist with head support, but was essentially in a chin tuck position.  Patient appeared to have poor upper body strength and poor trunk support as well.    Treatment Recommendation  Therapy as outlined in treatment plan below    Diet Recommendation Dysphagia 2 (Fine chop);Nectar-thick liquid   Liquid Administration via: Cup Medication Administration: Crushed with puree Supervision: Staff feed patient;Full supervision/cueing for compensatory strategies Compensations: Slow rate;Small sips/bites (Cervical collar for po's) Postural Changes and/or Swallow Maneuvers: Seated upright 90 degrees    Other  Recommendations Oral Care Recommendations: Oral care QID;Staff/trained caregiver to provide oral care Other Recommendations: Order thickener from pharmacy;Prohibited food (jello, ice cream, thin soups);Have oral suction available;Clarify dietary restrictions   Follow Up Recommendations  Skilled Nursing facility    Frequency and Duration min 2x/week  2 weeks   Pertinent Vitals/Pain N/A    SLP Swallow Goals Patient will consume recommended diet without observed clinical signs of aspiration with: Maximum assistance Patient will utilize recommended strategies during swallow to increase swallowing safety with: Maximum assistance   General HPI: 79 year old female with past medical history of dementia, depression who was transferred from [facility] 02/02/2013 due to low heart rate (48) and oxygen saturation of 88%. There was no reports of shortness of breat or fevers in nursing home. Patient is not a good historian due to dementia. Per ED physician, nursing home staff reported patient had complaints of abdominal pain but there was no report of vomiting. No falls or loss of consciousness. Type of Study: Modified Barium Swallowing Study Reason for Referral: Objectively evaluate swallowing function Previous Swallow Assessment: BSE 02/03/13 Diet Prior to  this Study: Dysphagia 2 (chopped);Honey-thick liquids Temperature Spikes Noted: No Respiratory Status: Supplemental O2 delivered via (comment) History of Recent Intubation: No Behavior/Cognition: Alert;Cooperative;Confused;Distractible;Requires cueing;Decreased sustained attention Oral Cavity - Dentition: Dentures, top;Dentures, bottom Oral Motor / Sensory Function:  (Difficult to assess due to lack of head control) Self-Feeding Abilities: Total assist Patient Positioning: Upright in chair (Cervical collar to assist with head support) Baseline Vocal Quality: Clear Volitional Cough: Weak Volitional Swallow: Unable to elicit Anatomy: Within functional limits Pharyngeal Secretions: Not observed secondary MBS    Reason for Referral Objectively evaluate swallowing function   Oral Phase Oral Preparation/Oral Phase Oral Phase: Impaired Oral - Solids Oral - Mechanical Soft: Impaired mastication;Weak lingual manipulation;Incomplete tongue to palate contact;Reduced posterior propulsion;Holding of bolus;Piecemeal swallowing;Delayed oral transit Oral - Regular: Impaired mastication;Weak lingual manipulation;Incomplete tongue to palate contact;Reduced posterior propulsion;Holding of bolus;Piecemeal swallowing;Delayed oral transit   Pharyngeal Phase Pharyngeal Phase Pharyngeal Phase: Impaired Pharyngeal - Nectar Pharyngeal - Nectar Cup: Delayed swallow initiation;Premature spillage to valleculae;Premature spillage to pyriform sinuses;Pharyngeal residue - valleculae;Pharyngeal residue - pyriform sinuses Pharyngeal - Nectar Straw: Premature spillage to valleculae;Reduced tongue base retraction;Pharyngeal residue - valleculae;Pharyngeal residue - pyriform sinuses Pharyngeal - Thin Pharyngeal - Thin Cup: Delayed swallow initiation;Premature spillage to valleculae;Premature spillage to pyriform sinuses;Reduced laryngeal elevation;Reduced airway/laryngeal closure;Penetration/Aspiration during  swallow Penetration/Aspiration details (thin cup): Material enters airway, passes BELOW cords without attempt by patient to eject out (silent aspiration) Pharyngeal - Solids Pharyngeal - Regular: Delayed swallow initiation;Premature spillage to valleculae;Reduced tongue base retraction;Pharyngeal residue - valleculae Pharyngeal Phase - Comment Pharyngeal Comment: Cleared with consecutive dry swallow.  Cervical Esophageal Phase    GO    Cervical Esophageal Phase Cervical Esophageal Phase: Impaired Cervical Esophageal Phase - Comment Cervical Esophageal Comment: Prominent C-P segment, with dialated appearance of esophagus just under the UES (? mild achalasia) (Appeared to have esophageal dysmotility with tertiary contra)         [name] T 02/04/2013, 1:08 PM

## 2013-02-04 NOTE — Progress Notes (Signed)
NEURO HOSPITALIST PROGRESS NOTE   SUBJECTIVE:                                                                                                                        Resting comfortably in bed. No complaints.  Able to follow commands.   OBJECTIVE:                                                                                                                           Vital signs in last 24 hours: Temp:  [97.4 F (36.3 C)-98.3 F (36.8 C)] 97.4 F (36.3 C) (03/11 1200) Pulse Rate:  [59-67] 64 (03/11 1200) Resp:  [17-23] 22 (03/11 1200) BP: (85-112)/(32-89) 96/32 mmHg (03/11 1200) SpO2:  [86 %-100 %] 95 % (03/11 1200) FiO2 (%):  [50 %] 50 % (03/11 0800) Weight:  [40.7 kg (89 lb 11.6 oz)] 40.7 kg (89 lb 11.6 oz) (03/11 0400)  Intake/Output from previous day: 03/10 0701 - 03/11 0700 In: 610 [I.V.:490; IV Piggyback:120] Out: 980 [Urine:980] Intake/Output this shift: Total I/O In: 318.8 [P.O.:120; I.V.:160; IV Piggyback:38.8] Out: 475 [Urine:475] Nutritional status: Dysphagia  Past Medical History  Diagnosis Date  . CHF (congestive heart failure)   . 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 Esophageal reflux   . Peripheral vascular disease   . Altered mental status   . Osteoarthrosis   . Hypertension   . Depressive disorder      Neurologic Exam:  Mental Status: Alert, oriented to hospital, thought content appropriate.  Speech fluent without evidence of aphasia.  Able to follow 3 step commands without difficulty. Cranial Nerves: II: Discs flat bilaterally; Visual fields grossly normal, pupils equal, round, reactive to light and accommodation III,IV, VI: ptosis not present, extra-ocular motions intact bilaterally V,VII: smile symmetric, facial light touch sensation normal bilaterally VIII: hearing normal bilaterally IX,X: gag reflex present XI: bilateral shoulder shrug XII: midline tongue extension Motor: Moving all extremities  antigravity.  Tone and bulk:normal tone throughout; no atrophy noted Sensory: Pinprick and light touch intact throughout, bilaterally Deep Tendon Reflexes: 1+ and symmetric throughout Plantars: mute Cerebellar: normal finger-to-nose,  normal heel-to-shin test  CV: pulses palpable throughout    Lab Results: No results found for this basename: cbc,  bmp, coags, chol, tri, ldl, hga1c   Lipid Panel No results found for this basename: CHOL, TRIG, HDL, CHOLHDL, VLDL, LDLCALC,  in the last 72 hours  Studies/Results: Ct Head Wo Contrast  02/03/2013  *RADIOLOGY REPORT*  Clinical Data: Left arm weakness.  Encephalopathy.  CT HEAD WITHOUT CONTRAST  Technique:  Contiguous axial images were obtained from the base of the skull through the vertex without contrast.  Comparison: 01/10/2010  Findings: There is no acute intracranial hemorrhage, infarction, or mass lesion.  There is diffuse cerebral cortical and cerebellar atrophy.  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  Chronic mucosal thickening in the paranasal sinuses, stable.  IMPRESSION: No acute abnormality.  Diffuse atrophy.   Original Report Authenticated By: Francene Boyers, M.D.    US Abdomen Complete  02/03/2013  *RADIOLOGY REPORT*  Clinical Data:  Acute renal failure.  Elevated LFTs.  History of CHF, hypertension.  COMPLETE ABDOMINAL ULTRASOUND  Comparison:  CT of the abdomen and pelvis on 09/05/2009  Findings:  Gallbladder:  Gallbladder wall is thickened, 3.7 mm.  No gallstones or pericholecystic fluid identified.  No sonographic Murphy's sign.  Common bile duct:  4.6 mm, within normal limits.  Liver:  There is limited visualization of the lower.  Visualized aspect shows no focal lesion.  IVC:  Appears normal.  Pancreas:  There is limited visualization of the pancreas because of bowel gas.  Spleen:  The visualized portion of the spleen is normal in appearance, 5.3 cm in length.  Right Kidney:  There is parenchymal thinning  of the right kidney. Length is 8.7 cm.  No focal mass or hydronephrosis.  Left Kidney:  9.7 cm in length.  No focal mass or hydronephrosis.  Abdominal aorta:  Abdominal aorta is 1.8 cm in diameter.  Additional findings:  Ascites and right pleural effusion are noted.  IMPRESSION:  1.  Thickened gallbladder wall which can be a nonspecific finding, often associated with ascites. 2.  No other evidence for acute cholecystitis. 3.  Limited evaluation of the liver and pancreas. 4.  Parenchymal thinning of the right kidney is felt to be a chronic abnormality, but likely new since exam in 2010. Consider CT of the abdomen pelvis without contrast to evaluate for possible obstruction.   Original Report Authenticated By: Norva Pavlov, M.D.    Dg Chest Port 1 View  02/03/2013  *RADIOLOGY REPORT*  Clinical Data: Pulmonary infiltrates.  PORTABLE CHEST - 1 VIEW  Comparison: 02/02/2013 and 01/15/2010  Findings: The heart size and pulmonary vascularity are normal. There is minimal atelectasis in the right mid and lower lung zone and at the left base medially.  No consolidative infiltrates or effusions.  No acute osseous abnormality.  IMPRESSION: Slight atelectasis.   Original Report Authenticated By: Francene Boyers, M.D.    Dg Chest Portable 1 View  02/02/2013  *RADIOLOGY REPORT*  Clinical Data: Bradycardia, history of CHF  PORTABLE CHEST - 1 VIEW  Comparison: 01/15/2010  Findings: Cardiomegaly with pulmonary vascular congestion and suspected mild interstitial edema.  Mild patchy left basilar opacity, atelectasis versus pneumonia.  Underlying chronic interstitial markings.  No pneumothorax.  Left lung base is obscured by defibrillator pads.  IMPRESSION: Cardiomegaly with pulmonary vascular congestion and suspected mild interstitial edema.  Mild patchy left basilar opacity, atelectasis versus pneumonia.   Original Report Authenticated By: Charline Bills, M.D.     MEDICATIONS  Scheduled: . antiseptic oral rinse  15 mL Mouth Rinse BID  . aspirin  81 mg Oral Daily  . calcium-vitamin D  1 tablet Oral Daily  . dextrose  1 ampule Intravenous Once  . docusate sodium  100 mg Oral BID  . enoxaparin (LOVENOX) injection  30 mg Subcutaneous Q24H  . escitalopram  10 mg Oral Daily  . furosemide  40 mg Oral Daily  . guaiFENesin  600 mg Oral BID  . insulin aspart  10 Units Intravenous Once  . mirtazapine  7.5 mg Oral QHS  . multivitamin with minerals  1 tablet Oral Daily  . polyvinyl alcohol  1 drop Both Eyes BID  . sodium chloride  3 mL Intravenous Q12H    ASSESSMENT/PLAN:                                                                                                            79 YO female with known dementia, body habitus of extreme cachexia who on exam is moving all extremities with 4/5 strength.  CT head was negative for mass or bleed.  2 D echo showed improved ventricular function compared to previous study. B12, TSH WNL.  Weakness likely secondary to anemia, underlying metabolic abnormalities as stated in previous note.  NO further recommendations per neurology.      Assessment and plan discussed with with attending physician and they are in agreement.    Felicie Morn PA-C [facility] [phone]  02/04/2013, 3:50 PM

## 2013-02-04 NOTE — Progress Notes (Signed)
[facility] PROGRESS NOTE  Joleene Burnham UJW:[id number] DOB: 1925/04/08 DOA: 02/02/2013 PCP: No primary provider on file.  Brief History 79 year old female with a history of dementia, depression, CHF, hypertension and stroke presents with bradycardia and low oxygen saturation from her nursing home. The patient has dementia and is unable to provide any history whatsoever. She is on an oxygen saturation of 88% on RA. Workup has revealed proBNP 22,537 with chest x-ray suggestive of pulmonary vascular engorgement and increased interstitial changes when compared to previous chest x-rays in 2013. Initial WBC count was 11.9 with temperature of 99.49F. The patient was initially started on Zosyn because of concerns for pneumonia. However, the patient's procalcitonin has been 0.10 and <0.10. The patient was also noted to be hyperkalemic with potassium 6.7, serum creatinine 1.52 at the time of admission. The patient had been given 2 doses of Kayexalate with correction of her potassium. The patient was given furosemide 40 mg IV x1 dose and put out approximately 1200 cc of urine upon arrival to the floor. Repeat chest x-ray shows improvement of her pulmonary vascular engorgement, but continued bilateral lower lobe atelectasis/opacities. The patient has not been tachycardic nor febrile. Her Zosyn was discontinued after 24 hours on 02/04/2013. The patient has borderline low blood pressures since given furosemide ranging normally in the mid to upper 90s. However she has occasional blood pressures in the mid 80s, and received approximately 500 cc of fluid in total as a result of her mild hypotension. Assessment/Plan: Acute respiratory failure with hypoxemia -Due to acute on chronic diastolic CHF -Oxygen saturation has gradually improved -No respiratory distress presently over the past 36 hours -Oxygen saturation 95-90% on 2 L Acute on chronic diastolic CHF--predominantly right-sided heart failure -Echocardiogram EF  65-70% with septal flattening suggesting RV overload -Continue judicious furosemide -Serum creatinine has improved with diuresis -Strict I.'s and O.'s -Daily weights Ascites/Transaminasemia -Likely related to the patient's right heart failure resulting in chronic hepatic congestion -Will not pursue any aggressive measures including paracentesis to obtain SAAG -Spoke with the patient's sister (POA) who does not want any invasive procedures at this time -trend liver enzymes -Continue judicious diuresis--lasix 40mg  po qday -Abdominal ultrasound shows thickened gallbladder--likely due to the patient's ascites--no clinical sign of cholecystitis on exam Head drop -Patient has not had muscle tone to her upper head, suspect underlying myopathy although may have been related partly to electrolyte abnormalities -CPK 271 -soft cervical collar during meals to lessen risk of aspiration -Speech has evaluated the patient and recommended dysphagia 2 diet -Neurology has been consult and and is following patient -May need to check aldolase -DC gabapentin -CT brain neg Dysphagia -As discussed above -Modified barium swallow showed silent aspiration of thin liquids CODE STATUS -Currently full code -Had extensive discussion with the patient's sister -She is to discuss with her family, but is leaning to making patient DNR   Family Communication:   Updated patient's sister by telephone Disposition Plan:   Back to SNF     Antibiotics:  Zosyn 02/03/2013>>> 02/04/2013    Procedures/Studies: Ct Head Wo Contrast  02/03/2013  *RADIOLOGY REPORT*  Clinical Data: Left arm weakness.  Encephalopathy.  CT HEAD WITHOUT CONTRAST  Technique:  Contiguous axial images were obtained from the base of the skull through the vertex without contrast.  Comparison: 01/10/2010  Findings: There is no acute intracranial hemorrhage, infarction, or mass lesion.  There is diffuse cerebral cortical and cerebellar atrophy.   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  Chronic mucosal thickening  in the paranasal sinuses, stable.  IMPRESSION: No acute abnormality.  Diffuse atrophy.   Original Report Authenticated By: Francene Boyers, M.D.    US Abdomen Complete  02/03/2013  *RADIOLOGY REPORT*  Clinical Data:  Acute renal failure.  Elevated LFTs.  History of CHF, hypertension.  COMPLETE ABDOMINAL ULTRASOUND  Comparison:  CT of the abdomen and pelvis on 09/05/2009  Findings:  Gallbladder:  Gallbladder wall is thickened, 3.7 mm.  No gallstones or pericholecystic fluid identified.  No sonographic Murphy's sign.  Common bile duct:  4.6 mm, within normal limits.  Liver:  There is limited visualization of the lower.  Visualized aspect shows no focal lesion.  IVC:  Appears normal.  Pancreas:  There is limited visualization of the pancreas because of bowel gas.  Spleen:  The visualized portion of the spleen is normal in appearance, 5.3 cm in length.  Right Kidney:  There is parenchymal thinning of the right kidney. Length is 8.7 cm.  No focal mass or hydronephrosis.  Left Kidney:  9.7 cm in length.  No focal mass or hydronephrosis.  Abdominal aorta:  Abdominal aorta is 1.8 cm in diameter.  Additional findings:  Ascites and right pleural effusion are noted.  IMPRESSION:  1.  Thickened gallbladder wall which can be a nonspecific finding, often associated with ascites. 2.  No other evidence for acute cholecystitis. 3.  Limited evaluation of the liver and pancreas. 4.  Parenchymal thinning of the right kidney is felt to be a chronic abnormality, but likely new since exam in 2010. Consider CT of the abdomen pelvis without contrast to evaluate for possible obstruction.   Original Report Authenticated By: Norva Pavlov, M.D.    Dg Chest Port 1 View  02/03/2013  *RADIOLOGY REPORT*  Clinical Data: Pulmonary infiltrates.  PORTABLE CHEST - 1 VIEW  Comparison: 02/02/2013 and 01/15/2010  Findings: The heart size and pulmonary  vascularity are normal. There is minimal atelectasis in the right mid and lower lung zone and at the left base medially.  No consolidative infiltrates or effusions.  No acute osseous abnormality.  IMPRESSION: Slight atelectasis.   Original Report Authenticated By: Francene Boyers, M.D.    Dg Chest Portable 1 View  02/02/2013  *RADIOLOGY REPORT*  Clinical Data: Bradycardia, history of CHF  PORTABLE CHEST - 1 VIEW  Comparison: 01/15/2010  Findings: Cardiomegaly with pulmonary vascular congestion and suspected mild interstitial edema.  Mild patchy left basilar opacity, atelectasis versus pneumonia.  Underlying chronic interstitial markings.  No pneumothorax.  Left lung base is obscured by defibrillator pads.  IMPRESSION: Cardiomegaly with pulmonary vascular congestion and suspected mild interstitial edema.  Mild patchy left basilar opacity, atelectasis versus pneumonia.   Original Report Authenticated By: Charline Bills, M.D.          Subjective: Patient is mostly confused. She denies any headache, chest pain, shortness breath, abdominal pain. No reports of vomiting or diarrhea. No respiratory distress. No fevers or chills.   Objective: Filed Vitals:   02/04/13 0900 02/04/13 1000 02/04/13 1100 02/04/13 1200  BP: 108/89 99/62 90/39  96/32  Pulse: 62 62 67 64  Temp:    97.4 F (36.3 C)  TempSrc:    Oral  Resp: 20 23 21 22   Height:      Weight:      SpO2: 98% 97% 97% 95%    Intake/Output Summary (Last 24 hours) at 02/04/13 1509 Last data filed at 02/04/13 1500  Gross per 24 hour  Intake 628.75 ml  Output    980  ml  Net -351.25 ml   Weight change: 2.6 kg (5 lb 11.7 oz) Exam:   General:  Pt is alert, intermittenly follows commands appropriately, not in acute distress  HEENT: No icterus, No thrush,  [place]/AT  Cardiovascular: RRR, S1/S2, no rubs, no gallops  Respiratory: Bibasilar crackles, recurrent left. No wheezes or rhonchi. Good air movement.  Abdomen: Soft/+BS, non tender, non  distended, no guarding  Extremities: trace edema, No lymphangitis, No petechiae, No rashes, no synovitis  Data Reviewed: Basic Metabolic Panel:  Recent Labs Lab 02/02/13 1644 02/03/13 0005 02/03/13 0330 02/04/13 0340  NA 134* 134* 136 139  K 6.7* 4.2 4.0 3.3*  CL 99 93* 96 98  CO2 20 28 27  33*  GLUCOSE 88 144* 111* 106*  BUN 59* 59* 58* 44*  CREATININE 1.52* 1.42* 1.33* 1.01  CALCIUM 8.3* 8.3* 7.9* 7.9*  MG 2.0 2.0  --  2.0  PHOS  --  6.9*  --   --    Liver Function Tests:  Recent Labs Lab 02/02/13 1644 02/03/13 0005 02/03/13 0330  AST 112* 142* 130*  ALT 94* 120* 118*  ALKPHOS 99 101 96  BILITOT 0.5 0.3 0.2*  PROT 5.3* 5.5* 5.2*  ALBUMIN 2.7* 2.9* 2.7*   No results found for this basename: LIPASE, AMYLASE,  in the last 168 hours No results found for this basename: AMMONIA,  in the last 168 hours CBC:  Recent Labs Lab 02/02/13 1644 02/03/13 0005 02/03/13 0330 02/04/13 0340  WBC 11.9* 10.1 9.9 7.4  NEUTROABS 9.5* 8.2*  --   --   HGB 8.8* 8.6* 8.4* 8.8*  HCT 30.8* 30.2* 29.6* 31.6*  MCV 78.6 78.0 77.7* 78.2  PLT 352 296 271 239   Cardiac Enzymes:  Recent Labs Lab 02/02/13 1644 02/03/13 0925 02/03/13 1422 02/03/13 1423  CKTOTAL  --   --   --  271*  TROPONINI <0.30 <0.30 <0.30  --    BNP: No components found with this basename: POCBNP,  CBG:  Recent Labs Lab 02/02/13 1803 02/03/13 0734 02/04/13 0745  GLUCAP 87 89 119*    Recent Results (from the past 240 hour(s))  CULTURE, BLOOD (ROUTINE X 2)     Status: None   Collection Time    02/02/13  5:50 PM      Result Value Range Status   Specimen Description BLOOD RIGHT HAND   Final   Special Requests BOTTLES DRAWN AEROBIC AND ANAEROBIC 2.5CC EACH   Final   Culture  Setup Time 02/02/2013 23:28   Final   Culture     Final   Value:        BLOOD CULTURE RECEIVED NO GROWTH TO DATE CULTURE WILL BE HELD FOR 5 DAYS BEFORE ISSUING A FINAL NEGATIVE REPORT   Report Status PENDING   Incomplete   CULTURE, BLOOD (ROUTINE X 2)     Status: None   Collection Time    02/02/13  6:42 PM      Result Value Range Status   Specimen Description BLOOD RIGHT ARM   Final   Special Requests BOTTLES DRAWN AEROBIC AND ANAEROBIC 4CC EACH   Final   Culture  Setup Time 02/02/2013 23:28   Final   Culture     Final   Value:        BLOOD CULTURE RECEIVED NO GROWTH TO DATE CULTURE WILL BE HELD FOR 5 DAYS BEFORE ISSUING A FINAL NEGATIVE REPORT   Report Status PENDING   Incomplete  MRSA PCR SCREENING  Status: None   Collection Time    02/02/13  8:54 PM      Result Value Range Status   MRSA by PCR NEGATIVE  NEGATIVE Final   Comment:            The GeneXpert MRSA Assay (FDA     approved for NASAL specimens     only), is one component of a     comprehensive MRSA colonization     surveillance program. It is not     intended to diagnose MRSA     infection nor to guide or     monitor treatment for     MRSA infections.     Scheduled Meds: . antiseptic oral rinse  15 mL Mouth Rinse BID  . aspirin  81 mg Oral Daily  . calcium-vitamin D  1 tablet Oral Daily  . dextrose  1 ampule Intravenous Once  . docusate sodium  100 mg Oral BID  . enoxaparin (LOVENOX) injection  30 mg Subcutaneous Q24H  . escitalopram  10 mg Oral Daily  . furosemide  40 mg Oral Daily  . guaiFENesin  600 mg Oral BID  . insulin aspart  10 Units Intravenous Once  . mirtazapine  7.5 mg Oral QHS  . multivitamin with minerals  1 tablet Oral Daily  . polyvinyl alcohol  1 drop Both Eyes BID  . sodium chloride  3 mL Intravenous Q12H   Continuous Infusions: . sodium chloride       TAT, DAVID, DO  [facility] Pager [phone]  If 7PM-7AM, please contact night-coverage [email or web address] Password [email or web address] 02/04/2013, 3:09 PM   LOS: 2 days

## 2013-02-05 DIAGNOSIS — N390 Urinary tract infection, site not specified: Secondary | ICD-10-CM

## 2013-02-05 DIAGNOSIS — E876 Hypokalemia: Secondary | ICD-10-CM | POA: Diagnosis not present

## 2013-02-05 LAB — BASIC METABOLIC PANEL
CO2: 39 mEq/L — ABNORMAL HIGH (ref 19–32)
Chloride: 96 mEq/L (ref 96–112)
GFR calc non Af Amer: 88 mL/min — ABNORMAL LOW (ref >90)
Glucose, Bld: 104 mg/dL — ABNORMAL HIGH (ref 70–99)
Potassium: 3.3 mEq/L — ABNORMAL LOW (ref 3.5–5.1)
Sodium: 140 mEq/L (ref 135–145)

## 2013-02-05 LAB — COMPREHENSIVE METABOLIC PANEL
ALT: 235 U/L — ABNORMAL HIGH (ref 0–35)
AST: 156 U/L — ABNORMAL HIGH (ref 0–37)
Albumin: 2.7 g/dL — ABNORMAL LOW (ref 3.5–5.2)
Calcium: 8 mg/dL — ABNORMAL LOW (ref 8.4–10.5)
Creatinine, Ser: 0.65 mg/dL (ref 0.50–1.10)
Sodium: 142 mEq/L (ref 135–145)

## 2013-02-05 LAB — CBC
HCT: 34.7 % — ABNORMAL LOW (ref 36.0–46.0)
Hemoglobin: 9.6 g/dL — ABNORMAL LOW (ref 12.0–15.0)
MCH: 22.3 pg — ABNORMAL LOW (ref 26.0–34.0)
MCHC: 27.7 g/dL — ABNORMAL LOW (ref 30.0–36.0)
MCV: 80.7 fL (ref 78.0–100.0)
RBC: 4.3 MIL/uL (ref 3.87–5.11)

## 2013-02-05 MED ORDER — POTASSIUM CHLORIDE CRYS ER 20 MEQ PO TBCR
40.0000 meq | EXTENDED_RELEASE_TABLET | ORAL | Status: AC
  Administered 2013-02-05 (×3): 40 meq via ORAL
  Filled 2013-02-05 (×3): qty 2

## 2013-02-05 MED ORDER — POTASSIUM CHLORIDE 10 MEQ/100ML IV SOLN
10.0000 meq | INTRAVENOUS | Status: AC
  Administered 2013-02-05 (×4): 10 meq via INTRAVENOUS
  Filled 2013-02-05 (×5): qty 100

## 2013-02-05 MED ORDER — POTASSIUM CHLORIDE CRYS ER 20 MEQ PO TBCR
40.0000 meq | EXTENDED_RELEASE_TABLET | Freq: Once | ORAL | Status: AC
  Administered 2013-02-05: 40 meq via ORAL
  Filled 2013-02-05: qty 2

## 2013-02-05 MED ORDER — DEXTROSE 5 % IV SOLN
1.0000 g | INTRAVENOUS | Status: DC
  Administered 2013-02-05 – 2013-02-11 (×7): 1 g via INTRAVENOUS
  Filled 2013-02-05 (×7): qty 10

## 2013-02-05 NOTE — Progress Notes (Signed)
CRITICAL VALUE ALERT  Critical value received:  K 2.2  Date of notification:  02/05/13  Time of notification: 0635  Critical value read back:yes  Nurse who received alert:  Casilda Carls RN   MD notified (1st page):  Merdis Delay NP  Time of first page:  [phone]  MD notified (2nd page):K Schorr NP   Time of second page: [phone]

## 2013-02-05 NOTE — Progress Notes (Signed)
Physical Therapy Treatment Patient Details Name: Carrie [name] MRN: [id number] DOB: 1925-05-26 Today's Date: 02/05/2013 Time: 4098-1191 PT Time Calculation (min): 24 min  PT Assessment / Plan / Recommendation Comments on Treatment Session  Pt still requiring +2 assist for bed mobility and sitting EOB.  Pt with soaked linen and gown so changed gown and RN in to assist with changing linen with pt rolling.    Follow Up Recommendations  SNF;Supervision/Assistance - 24 hour     Does the patient have the potential to tolerate intense rehabilitation     Barriers to Discharge        Equipment Recommendations  None recommended by PT    Recommendations for Other Services    Frequency     Plan Discharge plan remains appropriate;Frequency remains appropriate    Precautions / Restrictions Precautions Precautions: Fall Precaution Comments: posterior lean with sitting   Pertinent Vitals/Pain n/a    Mobility  Bed Mobility Bed Mobility: Sit to Supine;Supine to Sit;Rolling Right;Rolling Left Rolling Right: 1: +2 Total assist Rolling Right: Patient Percentage: 0% Rolling Left: 1: +2 Total assist Rolling Left: Patient Percentage: 0% Supine to Sit: 1: +2 Total assist Supine to Sit: Patient Percentage: 10% Sit to Supine: 1: +2 Total assist Sit to Supine: Patient Percentage: 20% Details for Bed Mobility Assistance: assist for upper and lower body, pt attempted to assist upright with pulling up with UEs and returning to supine by assisting slightly with trunk, pt cued to assist with rolling however unable to provide assist Transfers Transfers: Not assessed    Exercises     PT Diagnosis:    PT Problem List:   PT Treatment Interventions:     PT Goals Acute Rehab PT Goals PT Goal: Rolling Supine to Right Side - Progress: Progressing toward goal PT Goal: Rolling Supine to Left Side - Progress: Progressing toward goal PT Goal: Supine/Side to Sit - Progress: Progressing toward goal PT  Goal: Sit at Edge Of Bed - Progress: Progressing toward goal PT Goal: Sit to Supine/Side - Progress: Progressing toward goal Additional Goals PT Goal: Additional Goal #1 - Progress: Progressing toward goal  Visit Information  Last PT Received On: 02/05/13 Assistance Needed: +2    Subjective Data  Subjective: Where's my sister?   Cognition  Cognition Overall Cognitive Status: History of cognitive impairments - at baseline Arousal/Alertness: Lethargic Orientation Level: Disoriented to;Place;Time;Situation Behavior During Session: Lethargic    Balance  Balance Balance Assessed: Yes Static Sitting Balance Static Sitting - Balance Support: Bilateral upper extremity supported Static Sitting - Level of Assistance: 2: Max assist;1: +1 Total assist Static Sitting - Comment/# of Minutes: pt requiring assist for trunk control with posterior lean however able to correct for very short periods with verbal cues as well as cues to increase neck extension, pt able to take a few sips of thickened drink and changed gown sitting EOB due to soaked with urine  End of Session PT - End of Session Equipment Utilized During Treatment: Oxygen Activity Tolerance: Patient limited by fatigue Patient left: with call bell/phone within reach;with bed alarm set;with family/visitor present;in bed   GP     LEMYRE,KATHrine E 02/05/2013, 3:46 PM Zenovia Jarred, PT, DPT 02/05/2013 Pager: [phone]

## 2013-02-05 NOTE — Progress Notes (Signed)
Speech Language Pathology Dysphagia Treatment Patient Details Name: Carrie [name] MRN: [id number] DOB: [birth year] Today's Date: 02/05/2013 Time: 1530-1600 SLP Time Calculation (min): 30 min  Assessment / Plan / Recommendation Clinical Impression  Pt seen for skilled dysphagia tx, today pt able to hold her own cup to give herself nectar liquid via straw without anterior spillage or oral retention!  No overt clinical indications of aspiration observed- however pt silently aspirated during MBS study yesterday.  Intake remains poor, however noted pt was taking remeron prior to admission due to nutritional concerns.  Pt declined to attempt solids/applesauce/icecream offered during today's session.    Rn reports pt tolerating medicine with icecream well.  Nurse tech reports pt declined to eat lunch.  Positioning remains an issue for this pt, but cervical collar appears helpful to decrease head drop.    Pt will need continued full supervision for airway protection and to encourage adequacy of intake.  Rec continue current diet with full precautions.  SLP to follow to monitor tolerance, modify diet as indicated and to educate family/pt.      Diet Recommendation  Continue with Current Diet: Dysphagia 2 (fine chop);Nectar-thick liquid    SLP Plan Continue with current plan of care   Pertinent Vitals/Pain Afebrile, decreased, poor intake   Swallowing Goals  SLP Swallowing Goals Patient will consume recommended diet without observed clinical signs of aspiration with:  (progressing) Swallow Study Goal #3 - Progress: Progressing toward goal  General Temperature Spikes Noted: No Respiratory Status: Supplemental O2 delivered via (comment) Behavior/Cognition: Alert;Hard of hearing;Requires cueing;Cooperative;Pleasant mood;Decreased sustained attention Oral Cavity - Dentition: Dentures, top;Dentures, bottom Patient Positioning: Upright in bed  Oral Cavity - Oral Hygiene   oral cavity  clean  Dysphagia Treatment Treatment focused on: Skilled observation of diet tolerance;Patient/family/caregiver education Family/Caregiver Educated: pt- no family present Treatment Methods/Modalities: Skilled observation;Differential diagnosis Patient observed directly with PO's: Yes Type of PO's observed: Nectar-thick liquids (pt declined all othera intake offered, applesauce/icecream) Feeding: Needs assist (able to hold cup to feed herself via straw) Liquids provided via: Straw Pharyngeal Phase Signs & Symptoms: Suspected delayed swallow initiation Type of cueing: Verbal Amount of cueing: Moderate (to take small boluses)   GO     Donavan Burnet, MS [facility] SLP [phone]

## 2013-02-05 NOTE — Progress Notes (Signed)
[facility] PROGRESS NOTE  Reveca Desmarais ZOX:[id number] DOB: Nov 13, 1925 DOA: 02/02/2013 PCP: No primary provider on file.  Brief History 79 year old female with a history of dementia, depression, CHF, hypertension and stroke presents with bradycardia and low oxygen saturation from her nursing home. The patient has dementia and is unable to provide any history whatsoever. She is on an oxygen saturation of 88% on RA. Workup has revealed proBNP 22,537 with chest x-ray suggestive of pulmonary vascular engorgement and increased interstitial changes when compared to previous chest x-rays in 2013. Initial WBC count was 11.9 with temperature of 99.72F. The patient was initially started on Zosyn because of concerns for pneumonia. However, the patient's procalcitonin has been 0.10 and <0.10. The patient was also noted to be hyperkalemic with potassium 6.7, serum creatinine 1.52 at the time of admission. The patient had been given 2 doses of Kayexalate with correction of her potassium. The patient was given furosemide 40 mg IV x1 dose and put out approximately 1200 cc of urine upon arrival to the floor. Repeat chest x-ray shows improvement of her pulmonary vascular engorgement, but continued bilateral lower lobe atelectasis/opacities. The patient has not been tachycardic nor febrile. Her Zosyn was discontinued after 24 hours on 02/04/2013. The patient has borderline low blood pressures since given furosemide ranging normally in the mid to upper 90s. However she has occasional blood pressures in the mid 80s, and received approximately 500 cc of fluid in total as a result of her mild hypotension. Assessment/Plan: Acute respiratory failure with hypoxemia -Due to acute on chronic diastolic CHF -Oxygen saturation has gradually improved -No respiratory distress presently over the past 48 hours -Oxygen saturation 95-90% on 2 L Acute on chronic diastolic CHF--predominantly right-sided heart failure -Echocardiogram EF  65-70% with septal flattening suggesting RV overload -Continue judicious furosemide -Serum creatinine has improved with diuresis -Strict I.'s and O.'s -Daily weights Ascites/Transaminasemia -Likely related to the patient's right heart failure resulting in chronic hepatic congestion -Will not pursue any aggressive measures including paracentesis to obtain SAAG -Dr Tat spoke with the patient's sister (POA) who does not want any invasive procedures at this time -trend liver enzymes -Continue judicious diuresis--lasix 40mg  po qday -Abdominal ultrasound shows thickened gallbladder--likely due to the patient's ascites--no clinical sign of cholecystitis on exam Head drop -Patient has not had muscle tone to her upper head, suspect underlying myopathy although may have been related partly to electrolyte abnormalities -CPK 271 -soft cervical collar during meals to lessen risk of aspiration -Speech has evaluated the patient and recommended dysphagia 2 diet -Neurology has been consult and and is following patient and feel patient's symptoms are likely metabolic in nature and no further neurological workup is needed at this time. -May need to check aldolase -DC gabapentin yesterday. -CT brain neg Hypokalemia Secondary to diuretics. Check a basic metabolic profile this afternoon. Repleted. Dysphagia -As discussed above -Modified barium swallow showed silent aspiration of thin liquids.  -Continue current diet. Urinary tract infection Urine cultures with greater than 100,000 gram-negative rods. Sensitivities are pending. Will start patient on IV Rocephin. CODE STATUS -Currently full code -Dr Tat had extensive discussion with the patient's sister -She is to discuss with her family, but is leaning to making patient DNR   Family Communication:   Updated patient AT BEDISDE.  Disposition Plan:   Back to SNF     Antibiotics:  Zosyn 02/03/2013>>> 02/04/2013  IV Rocephin  02/05/2013    Procedures/Studies: Ct Head Wo Contrast  02/03/2013  *RADIOLOGY REPORT*  Clinical Data: Left arm  weakness.  Encephalopathy.  CT HEAD WITHOUT CONTRAST  Technique:  Contiguous axial images were obtained from the base of the skull through the vertex without contrast.  Comparison: 01/10/2010  Findings: There is no acute intracranial hemorrhage, infarction, or mass lesion.  There is diffuse cerebral cortical and cerebellar atrophy.  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  Chronic mucosal thickening in the paranasal sinuses, stable.  IMPRESSION: No acute abnormality.  Diffuse atrophy.   Original Report Authenticated By: Francene Boyers, M.D.    US Abdomen Complete  02/03/2013  *RADIOLOGY REPORT*  Clinical Data:  Acute renal failure.  Elevated LFTs.  History of CHF, hypertension.  COMPLETE ABDOMINAL ULTRASOUND  Comparison:  CT of the abdomen and pelvis on 09/05/2009  Findings:  Gallbladder:  Gallbladder wall is thickened, 3.7 mm.  No gallstones or pericholecystic fluid identified.  No sonographic Murphy's sign.  Common bile duct:  4.6 mm, within normal limits.  Liver:  There is limited visualization of the lower.  Visualized aspect shows no focal lesion.  IVC:  Appears normal.  Pancreas:  There is limited visualization of the pancreas because of bowel gas.  Spleen:  The visualized portion of the spleen is normal in appearance, 5.3 cm in length.  Right Kidney:  There is parenchymal thinning of the right kidney. Length is 8.7 cm.  No focal mass or hydronephrosis.  Left Kidney:  9.7 cm in length.  No focal mass or hydronephrosis.  Abdominal aorta:  Abdominal aorta is 1.8 cm in diameter.  Additional findings:  Ascites and right pleural effusion are noted.  IMPRESSION:  1.  Thickened gallbladder wall which can be a nonspecific finding, often associated with ascites. 2.  No other evidence for acute cholecystitis. 3.  Limited evaluation of the liver and pancreas. 4.  Parenchymal  thinning of the right kidney is felt to be a chronic abnormality, but likely new since exam in 2010. Consider CT of the abdomen pelvis without contrast to evaluate for possible obstruction.   Original Report Authenticated By: Norva Pavlov, M.D.    Dg Chest Port 1 View  02/03/2013  *RADIOLOGY REPORT*  Clinical Data: Pulmonary infiltrates.  PORTABLE CHEST - 1 VIEW  Comparison: 02/02/2013 and 01/15/2010  Findings: The heart size and pulmonary vascularity are normal. There is minimal atelectasis in the right mid and lower lung zone and at the left base medially.  No consolidative infiltrates or effusions.  No acute osseous abnormality.  IMPRESSION: Slight atelectasis.   Original Report Authenticated By: Francene Boyers, M.D.    Dg Chest Portable 1 View  02/02/2013  *RADIOLOGY REPORT*  Clinical Data: Bradycardia, history of CHF  PORTABLE CHEST - 1 VIEW  Comparison: 01/15/2010  Findings: Cardiomegaly with pulmonary vascular congestion and suspected mild interstitial edema.  Mild patchy left basilar opacity, atelectasis versus pneumonia.  Underlying chronic interstitial markings.  No pneumothorax.  Left lung base is obscured by defibrillator pads.  IMPRESSION: Cardiomegaly with pulmonary vascular congestion and suspected mild interstitial edema.  Mild patchy left basilar opacity, atelectasis versus pneumonia.   Original Report Authenticated By: Charline Bills, M.D.          Subjective: Patient is mostly confused. She denies any headache, chest pain, shortness breath, abdominal pain. No respiratory distress. Friend at bedside.  Objective: Filed Vitals:   02/04/13 2059 02/04/13 2103 02/05/13 0624 02/05/13 0745  BP: 99/47  107/43 97/38  Pulse: 63  63 66  Temp: 98.3 F (36.8 C)  97.9 F (36.6 C)   TempSrc: Oral  Axillary   Resp: 20  20   Height:      Weight:   37.6 kg (82 lb 14.3 oz)   SpO2: 82% 92% 95% 94%    Intake/Output Summary (Last 24 hours) at 02/05/13 1050 Last data filed at 02/04/13  2358  Gross per 24 hour  Intake  207.5 ml  Output   1315 ml  Net -1107.5 ml   Weight change: -3.1 kg (-6 lb 13.4 oz) Exam:   General:  Pt is alert, intermittenly follows commands appropriately, not in acute distress  HEENT: No icterus, No thrush,  [place]/AT  Cardiovascular: RRR, S1/S2, no rubs, no gallops  Respiratory: Bibasilar crackles, recurrent left. No wheezes or rhonchi. Good air movement.  Abdomen: Soft/+BS, non tender, non distended, no guarding  Extremities:  No lymphangitis, No petechiae, No rashes, no synovitis  Data Reviewed: Basic Metabolic Panel:  Recent Labs Lab 02/02/13 1644 02/03/13 0005 02/03/13 0330 02/04/13 0340 02/05/13 0442  NA 134* 134* 136 139 142  K 6.7* 4.2 4.0 3.3* 2.2*  CL 99 93* 96 98 96  CO2 20 28 27  33* 39*  GLUCOSE 88 144* 111* 106* 100*  BUN 59* 59* 58* 44* 21  CREATININE 1.52* 1.42* 1.33* 1.01 0.65  CALCIUM 8.3* 8.3* 7.9* 7.9* 8.0*  MG 2.0 2.0  --  2.0 1.8  PHOS  --  6.9*  --   --   --    Liver Function Tests:  Recent Labs Lab 02/02/13 1644 02/03/13 0005 02/03/13 0330 02/04/13 0340 02/05/13 0442  AST 112* 142* 130* 199* 156*  ALT 94* 120* 118* 205* 235*  ALKPHOS 99 101 96 93 88  BILITOT 0.5 0.3 0.2* 0.3 0.3  PROT 5.3* 5.5* 5.2* 5.5* 5.6*  ALBUMIN 2.7* 2.9* 2.7* 2.7* 2.7*   No results found for this basename: LIPASE, AMYLASE,  in the last 168 hours No results found for this basename: AMMONIA,  in the last 168 hours CBC:  Recent Labs Lab 02/02/13 1644 02/03/13 0005 02/03/13 0330 02/04/13 0340 02/05/13 0442  WBC 11.9* 10.1 9.9 7.4 8.0  NEUTROABS 9.5* 8.2*  --   --   --   HGB 8.8* 8.6* 8.4* 8.8* 9.6*  HCT 30.8* 30.2* 29.6* 31.6* 34.7*  MCV 78.6 78.0 77.7* 78.2 80.7  PLT 352 296 271 239 235   Cardiac Enzymes:  Recent Labs Lab 02/02/13 1644 02/03/13 0925 02/03/13 1422 02/03/13 1423  CKTOTAL  --   --   --  271*  TROPONINI <0.30 <0.30 <0.30  --    BNP: No components found with this basename: POCBNP,   CBG:  Recent Labs Lab 02/02/13 1803 02/03/13 0734 02/04/13 0745 02/05/13 0741  GLUCAP 87 89 119* 98    Recent Results (from the past 240 hour(s))  CULTURE, BLOOD (ROUTINE X 2)     Status: None   Collection Time    02/02/13  5:50 PM      Result Value Range Status   Specimen Description BLOOD RIGHT HAND   Final   Special Requests BOTTLES DRAWN AEROBIC AND ANAEROBIC 2.5CC EACH   Final   Culture  Setup Time 02/02/2013 23:28   Final   Culture     Final   Value:        BLOOD CULTURE RECEIVED NO GROWTH TO DATE CULTURE WILL BE HELD FOR 5 DAYS BEFORE ISSUING A FINAL NEGATIVE REPORT   Report Status PENDING   Incomplete  URINE CULTURE     Status: None   Collection Time  02/02/13  6:35 PM      Result Value Range Status   Specimen Description URINE, CATHETERIZED   Final   Special Requests NONE   Final   Culture  Setup Time 02/03/2013 01:27   Final   Colony Count >=100,000 COLONIES/ML   Final   Culture GRAM NEGATIVE RODS   Final   Report Status PENDING   Incomplete  CULTURE, BLOOD (ROUTINE X 2)     Status: None   Collection Time    02/02/13  6:42 PM      Result Value Range Status   Specimen Description BLOOD RIGHT ARM   Final   Special Requests BOTTLES DRAWN AEROBIC AND ANAEROBIC 4CC EACH   Final   Culture  Setup Time 02/02/2013 23:28   Final   Culture     Final   Value:        BLOOD CULTURE RECEIVED NO GROWTH TO DATE CULTURE WILL BE HELD FOR 5 DAYS BEFORE ISSUING A FINAL NEGATIVE REPORT   Report Status PENDING   Incomplete  MRSA PCR SCREENING     Status: None   Collection Time    02/02/13  8:54 PM      Result Value Range Status   MRSA by PCR NEGATIVE  NEGATIVE Final   Comment:            The GeneXpert MRSA Assay (FDA     approved for NASAL specimens     only), is one component of a     comprehensive MRSA colonization     surveillance program. It is not     intended to diagnose MRSA     infection nor to guide or     monitor treatment for     MRSA infections.      Scheduled Meds: . antiseptic oral rinse  15 mL Mouth Rinse BID  . aspirin  81 mg Oral Daily  . calcium-vitamin D  1 tablet Oral Daily  . cefTRIAXone (ROCEPHIN)  IV  1 g Intravenous Q24H  . dextrose  1 ampule Intravenous Once  . docusate sodium  100 mg Oral BID  . enoxaparin (LOVENOX) injection  30 mg Subcutaneous Q24H  . escitalopram  10 mg Oral Daily  . furosemide  40 mg Oral Daily  . guaiFENesin  600 mg Oral BID  . insulin aspart  10 Units Intravenous Once  . mirtazapine  7.5 mg Oral QHS  . multivitamin with minerals  1 tablet Oral Daily  . polyvinyl alcohol  1 drop Both Eyes BID  . potassium chloride  10 mEq Intravenous Q1 Hr x 5  . potassium chloride  40 mEq Oral Q4H  . sodium chloride  3 mL Intravenous Q12H   Continuous Infusions: . sodium chloride       Veronika Heard, MD  [facility] Pager [phone]  If 7PM-7AM, please contact night-coverage [email or web address] Password [email or web address] 02/05/2013, 10:50 AM   LOS: 3 days

## 2013-02-06 LAB — CBC
MCH: 22.4 pg — ABNORMAL LOW (ref 26.0–34.0)
MCHC: 27.6 g/dL — ABNORMAL LOW (ref 30.0–36.0)
Platelets: 217 10*3/uL (ref 150–400)
RDW: 20 % — ABNORMAL HIGH (ref 11.5–15.5)

## 2013-02-06 LAB — URINE CULTURE: Colony Count: >=100000

## 2013-02-06 LAB — POTASSIUM: Potassium: 5.2 mEq/L — ABNORMAL HIGH (ref 3.5–5.1)

## 2013-02-06 LAB — COMPREHENSIVE METABOLIC PANEL
ALT: 158 U/L — ABNORMAL HIGH (ref 0–35)
AST: 68 U/L — ABNORMAL HIGH (ref 0–37)
CO2: 36 mEq/L — ABNORMAL HIGH (ref 19–32)
Chloride: 103 mEq/L (ref 96–112)
GFR calc Af Amer: >90 mL/min (ref >90)
GFR calc non Af Amer: 89 mL/min — ABNORMAL LOW (ref >90)
Glucose, Bld: 91 mg/dL (ref 70–99)
Sodium: 142 mEq/L (ref 135–145)
Total Bilirubin: 0.2 mg/dL — ABNORMAL LOW (ref 0.3–1.2)

## 2013-02-06 LAB — GLUCOSE, CAPILLARY: Glucose-Capillary: 88 mg/dL (ref 70–99)

## 2013-02-06 NOTE — Progress Notes (Signed)
[facility] PROGRESS NOTE  Arielle Eber ZOX:[id number] DOB: [birth year] DOA: 02/02/2013 PCP: No primary provider on file.  Brief History 79 year old female with a history of dementia, depression, CHF, hypertension and stroke presents with bradycardia and low oxygen saturation from her nursing home. The patient has dementia and is unable to provide any history whatsoever. She is on an oxygen saturation of 88% on RA. Workup has revealed proBNP 22,537 with chest x-ray suggestive of pulmonary vascular engorgement and increased interstitial changes when compared to previous chest x-rays in 2013. Initial WBC count was 11.9 with temperature of 99.35F. The patient was initially started on Zosyn because of concerns for pneumonia. However, the patient's procalcitonin has been 0.10 and <0.10. The patient was also noted to be hyperkalemic with potassium 6.7, serum creatinine 1.52 at the time of admission. The patient had been given 2 doses of Kayexalate with correction of her potassium. The patient was given furosemide 40 mg IV x1 dose and put out approximately 1200 cc of urine upon arrival to the floor. Repeat chest x-ray shows improvement of her pulmonary vascular engorgement, but continued bilateral lower lobe atelectasis/opacities. The patient has not been tachycardic nor febrile. Her Zosyn was discontinued after 24 hours on 02/04/2013. The patient has borderline low blood pressures since given furosemide ranging normally in the mid to upper 90s. However she has occasional blood pressures in the mid 80s, and received approximately 500 cc of fluid in total as a result of her mild hypotension. Assessment/Plan: Acute respiratory failure with hypoxemia -Due to acute on chronic diastolic CHF -Oxygen saturation has gradually improved -No respiratory distress presently over the past 48 hours -Oxygen saturation 95-90% on 2 L Acute on chronic diastolic CHF--predominantly right-sided heart failure -Echocardiogram EF  65-70% with septal flattening suggesting RV overload -Continue judicious furosemide -Serum creatinine has improved with diuresis -Strict I.'s and O.'s -Daily weights Ascites/Transaminasemia -Likely related to the patient's right heart failure resulting in chronic hepatic congestion -Will not pursue any aggressive measures including paracentesis to obtain SAAG -Dr Tat spoke with the patient's sister (POA) who does not want any invasive procedures at this time -trend liver enzymes -Continue judicious diuresis--lasix 40mg  po qday -Abdominal ultrasound shows thickened gallbladder--likely due to the patient's ascites--no clinical sign of cholecystitis on exam Head drop -Patient has not had muscle tone to her upper head, suspect underlying myopathy although may have been related partly to electrolyte abnormalities -CPK 271 -soft cervical collar during meals to lessen risk of aspiration -Speech has evaluated the patient and recommended dysphagia 2 diet -Neurology has been consult and and is following patient and feel patient's symptoms are likely metabolic in nature and no further neurological workup is needed at this time. -DC gabapentin yesterday. -CT brain neg Hypokalemia/hyperkalemia Secondary to diuretics. Patient now hyperkalemic likely secondary to aggressive replacement. Follow.  Dysphagia -As discussed above -Modified barium swallow showed silent aspiration of thin liquids.  -Continue current diet. Urinary tract infection Urine cultures with greater than 100,000 gram-negative rods. Sensitivities are pending. Continue IV Rocephin. CODE STATUS -patient's sister has decided on patient been a DO NOT RESUSCITATE.    Family Communication:   Updated patient and sister AT BEDISDE.  Disposition Plan:   Back to SNF     Antibiotics:  Zosyn 02/03/2013>>> 02/04/2013  IV Rocephin 02/05/2013    Procedures/Studies: Ct Head Wo Contrast  02/03/2013  *RADIOLOGY REPORT*  Clinical Data:  Left arm weakness.  Encephalopathy.  CT HEAD WITHOUT CONTRAST  Technique:  Contiguous axial images were obtained from the  base of the skull through the vertex without contrast.  Comparison: 01/10/2010  Findings: There is no acute intracranial hemorrhage, infarction, or mass lesion.  There is diffuse cerebral cortical and cerebellar atrophy.  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  Chronic mucosal thickening in the paranasal sinuses, stable.  IMPRESSION: No acute abnormality.  Diffuse atrophy.   Original Report Authenticated By: Francene Boyers, M.D.    US Abdomen Complete  02/03/2013  *RADIOLOGY REPORT*  Clinical Data:  Acute renal failure.  Elevated LFTs.  History of CHF, hypertension.  COMPLETE ABDOMINAL ULTRASOUND  Comparison:  CT of the abdomen and pelvis on 09/05/2009  Findings:  Gallbladder:  Gallbladder wall is thickened, 3.7 mm.  No gallstones or pericholecystic fluid identified.  No sonographic Murphy's sign.  Common bile duct:  4.6 mm, within normal limits.  Liver:  There is limited visualization of the lower.  Visualized aspect shows no focal lesion.  IVC:  Appears normal.  Pancreas:  There is limited visualization of the pancreas because of bowel gas.  Spleen:  The visualized portion of the spleen is normal in appearance, 5.3 cm in length.  Right Kidney:  There is parenchymal thinning of the right kidney. Length is 8.7 cm.  No focal mass or hydronephrosis.  Left Kidney:  9.7 cm in length.  No focal mass or hydronephrosis.  Abdominal aorta:  Abdominal aorta is 1.8 cm in diameter.  Additional findings:  Ascites and right pleural effusion are noted.  IMPRESSION:  1.  Thickened gallbladder wall which can be a nonspecific finding, often associated with ascites. 2.  No other evidence for acute cholecystitis. 3.  Limited evaluation of the liver and pancreas. 4.  Parenchymal thinning of the right kidney is felt to be a chronic abnormality, but likely new since exam in 2010. Consider  CT of the abdomen pelvis without contrast to evaluate for possible obstruction.   Original Report Authenticated By: Norva Pavlov, M.D.    Dg Chest Port 1 View  02/03/2013  *RADIOLOGY REPORT*  Clinical Data: Pulmonary infiltrates.  PORTABLE CHEST - 1 VIEW  Comparison: 02/02/2013 and 01/15/2010  Findings: The heart size and pulmonary vascularity are normal. There is minimal atelectasis in the right mid and lower lung zone and at the left base medially.  No consolidative infiltrates or effusions.  No acute osseous abnormality.  IMPRESSION: Slight atelectasis.   Original Report Authenticated By: Francene Boyers, M.D.    Dg Chest Portable 1 View  02/02/2013  *RADIOLOGY REPORT*  Clinical Data: Bradycardia, history of CHF  PORTABLE CHEST - 1 VIEW  Comparison: 01/15/2010  Findings: Cardiomegaly with pulmonary vascular congestion and suspected mild interstitial edema.  Mild patchy left basilar opacity, atelectasis versus pneumonia.  Underlying chronic interstitial markings.  No pneumothorax.  Left lung base is obscured by defibrillator pads.  IMPRESSION: Cardiomegaly with pulmonary vascular congestion and suspected mild interstitial edema.  Mild patchy left basilar opacity, atelectasis versus pneumonia.   Original Report Authenticated By: Charline Bills, M.D.          Subjective: Patient is mostly confused. She denies any headache, chest pain, shortness breath, abdominal pain. Per family patient more lethargic today.  Objective: Filed Vitals:   02/05/13 1306 02/05/13 2109 02/06/13 0436 02/06/13 0510  BP: 97/63 109/54  110/70  Pulse: 65 58  55  Temp: 98.5 F (36.9 C) 98.7 F (37.1 C)  97.7 F (36.5 C)  TempSrc: Axillary Oral  Axillary  Resp: 18 18  22   Height:  Weight:   38.3 kg (84 lb 7 oz)   SpO2: 96% 97%  92%    Intake/Output Summary (Last 24 hours) at 02/06/13 1225 Last data filed at 02/06/13 0956  Gross per 24 hour  Intake    690 ml  Output      0 ml  Net    690 ml   Weight  change: 0.7 kg (1 lb 8.7 oz) Exam:   General:  Pt is drowsy, intermittenly follows commands appropriately, not in acute distress  HEENT: No icterus, No thrush,  [place]/AT  Cardiovascular: RRR, S1/S2, no rubs, no gallops  Respiratory: Bibasilar crackles, recurrent left. No wheezes or rhonchi. Good air movement.  Abdomen: Soft/+BS, non tender, non distended, no guarding  Extremities:  No lymphangitis, No petechiae, No rashes, no synovitis  Data Reviewed: Basic Metabolic Panel:  Recent Labs Lab 02/02/13 1644 02/03/13 0005 02/03/13 0330 02/04/13 0340 02/05/13 0442 02/05/13 1540 02/06/13 0408 02/06/13 0745  NA 134* 134* 136 139 142 140 142  --   K 6.7* 4.2 4.0 3.3* 2.2* 3.3* 5.7* 5.2*  CL 99 93* 96 98 96 96 103  --   CO2 20 28 27  33* 39* 39* 36*  --   GLUCOSE 88 144* 111* 106* 100* 104* 91  --   BUN 59* 59* 58* 44* 21 14 11   --   CREATININE 1.52* 1.42* 1.33* 1.01 0.65 0.47* 0.45*  --   CALCIUM 8.3* 8.3* 7.9* 7.9* 8.0* 8.2* 8.0*  --   MG 2.0 2.0  --  2.0 1.8  --   --   --   PHOS  --  6.9*  --   --   --   --   --   --    Liver Function Tests:  Recent Labs Lab 02/03/13 0005 02/03/13 0330 02/04/13 0340 02/05/13 0442 02/06/13 0408  AST 142* 130* 199* 156* 68*  ALT 120* 118* 205* 235* 158*  ALKPHOS 101 96 93 88 77  BILITOT 0.3 0.2* 0.3 0.3 0.2*  PROT 5.5* 5.2* 5.5* 5.6* 5.2*  ALBUMIN 2.9* 2.7* 2.7* 2.7* 2.4*   No results found for this basename: LIPASE, AMYLASE,  in the last 168 hours No results found for this basename: AMMONIA,  in the last 168 hours CBC:  Recent Labs Lab 02/02/13 1644 02/03/13 0005 02/03/13 0330 02/04/13 0340 02/05/13 0442 02/06/13 0408  WBC 11.9* 10.1 9.9 7.4 8.0 7.1  NEUTROABS 9.5* 8.2*  --   --   --   --   HGB 8.8* 8.6* 8.4* 8.8* 9.6* 9.1*  HCT 30.8* 30.2* 29.6* 31.6* 34.7* 33.0*  MCV 78.6 78.0 77.7* 78.2 80.7 81.3  PLT 352 296 271 239 235 217   Cardiac Enzymes:  Recent Labs Lab 02/02/13 1644 02/03/13 0925 02/03/13 1422  02/03/13 1423  CKTOTAL  --   --   --  271*  TROPONINI <0.30 <0.30 <0.30  --    BNP: No components found with this basename: POCBNP,  CBG:  Recent Labs Lab 02/02/13 1803 02/03/13 0734 02/04/13 0745 02/05/13 0741 02/06/13 0737  GLUCAP 87 89 119* 98 88    Recent Results (from the past 240 hour(s))  CULTURE, BLOOD (ROUTINE X 2)     Status: None   Collection Time    02/02/13  5:50 PM      Result Value Range Status   Specimen Description BLOOD RIGHT HAND   Final   Special Requests BOTTLES DRAWN AEROBIC AND ANAEROBIC 2.5CC EACH   Final  Culture  Setup Time 02/02/2013 23:28   Final   Culture     Final   Value:        BLOOD CULTURE RECEIVED NO GROWTH TO DATE CULTURE WILL BE HELD FOR 5 DAYS BEFORE ISSUING A FINAL NEGATIVE REPORT   Report Status PENDING   Incomplete  URINE CULTURE     Status: None   Collection Time    02/02/13  6:35 PM      Result Value Range Status   Specimen Description URINE, CATHETERIZED   Final   Special Requests NONE   Final   Culture  Setup Time 02/03/2013 01:27   Final   Colony Count >=100,000 COLONIES/ML   Final   Culture KLEBSIELLA PNEUMONIAE   Final   Report Status 02/06/2013 FINAL   Final   Organism ID, Bacteria KLEBSIELLA PNEUMONIAE   Final  CULTURE, BLOOD (ROUTINE X 2)     Status: None   Collection Time    02/02/13  6:42 PM      Result Value Range Status   Specimen Description BLOOD RIGHT ARM   Final   Special Requests BOTTLES DRAWN AEROBIC AND ANAEROBIC 4CC EACH   Final   Culture  Setup Time 02/02/2013 23:28   Final   Culture     Final   Value:        BLOOD CULTURE RECEIVED NO GROWTH TO DATE CULTURE WILL BE HELD FOR 5 DAYS BEFORE ISSUING A FINAL NEGATIVE REPORT   Report Status PENDING   Incomplete  MRSA PCR SCREENING     Status: None   Collection Time    02/02/13  8:54 PM      Result Value Range Status   MRSA by PCR NEGATIVE  NEGATIVE Final   Comment:            The GeneXpert MRSA Assay (FDA     approved for NASAL specimens     only),  is one component of a     comprehensive MRSA colonization     surveillance program. It is not     intended to diagnose MRSA     infection nor to guide or     monitor treatment for     MRSA infections.     Scheduled Meds: . antiseptic oral rinse  15 mL Mouth Rinse BID  . aspirin  81 mg Oral Daily  . calcium-vitamin D  1 tablet Oral Daily  . cefTRIAXone (ROCEPHIN)  IV  1 g Intravenous Q24H  . dextrose  1 ampule Intravenous Once  . docusate sodium  100 mg Oral BID  . enoxaparin (LOVENOX) injection  30 mg Subcutaneous Q24H  . escitalopram  10 mg Oral Daily  . furosemide  40 mg Oral Daily  . guaiFENesin  600 mg Oral BID  . insulin aspart  10 Units Intravenous Once  . mirtazapine  7.5 mg Oral QHS  . multivitamin with minerals  1 tablet Oral Daily  . polyvinyl alcohol  1 drop Both Eyes BID  . sodium chloride  3 mL Intravenous Q12H   Continuous Infusions: . sodium chloride       THOMPSON,DANIEL, MD  [facility] Pager [phone]  If 7PM-7AM, please contact night-coverage [email or web address] Password [facility] 02/06/2013, 12:25 PM   LOS: 4 days

## 2013-02-07 LAB — GLUCOSE, CAPILLARY: Glucose-Capillary: 96 mg/dL (ref 70–99)

## 2013-02-07 LAB — BASIC METABOLIC PANEL
BUN: 10 mg/dL (ref 6–23)
Calcium: 8.5 mg/dL (ref 8.4–10.5)
Chloride: 99 mEq/L (ref 96–112)
Creatinine, Ser: 0.46 mg/dL — ABNORMAL LOW (ref 0.50–1.10)
GFR calc Af Amer: >90 mL/min (ref >90)
GFR calc non Af Amer: 88 mL/min — ABNORMAL LOW (ref >90)

## 2013-02-07 NOTE — Progress Notes (Signed)
Speech Language Pathology Dysphagia Treatment Patient Details Name: Carrie [name] MRN: [id number] DOB: 1925/05/01 Today's Date: 02/07/2013 Time: 0943-1000 SLP Time Calculation (min): 17 min  Assessment / Plan / Recommendation Clinical Impression  Pt seen today to skilled dysphagia tx and po tolerance.  Pt more alert today than yesterday per primary MD. SLP removed cervical collar and pt was able to adequate maintain head position independently to consume po- therefore rec Ccollar use only if needed.   Intake continues to be poor and pt declined to consume breakfast - eggs/oatmeal/icecream.  She requested drink repeatedly stating she was thirsty, but today was unable to hold her own cup.  SLP provided pt with nectar thick gingerale via straw without any clinical indications of aspiration nor delay.    She will require ongoing encouragement and full assist to eat which will likely negatively impact her intake.    Given pt SILENTLY aspirated thin on MBS, recommend to continue thickened liquids.  She would benefit from repeat instrumental assessment to determine appropriateness for dietary advancement prior to dc from [facility].      Diet Recommendation  Continue with Current Diet: Dysphagia 2 (fine chop);Nectar-thick liquid    SLP Plan Continue with current plan of care;MBS (rec repeat MBS prior to dc)   Pertinent Vitals/Pain Afebrile, decreased    General Behavior/Cognition: Alert;Hard of hearing;Cooperative;Requires cueing;Decreased sustained attention Oral Cavity - Dentition: Dentures, top;Dentures, bottom Patient Positioning: Upright in bed  Oral Cavity - Oral Hygiene   oral cavity xerostomic, slp provided liquids to moisten  Dysphagia Treatment Treatment focused on: Skilled observation of diet tolerance;Patient/family/caregiver education Family/Caregiver Educated: pt Treatment Methods/Modalities: Skilled observation;Differential diagnosis Patient observed directly with PO's: Yes Type of  PO's observed: Nectar-thick liquids Feeding: Total assist Liquids provided via: Straw Pharyngeal Phase Signs & Symptoms: Suspected delayed swallow initiation Type of cueing: Verbal Amount of cueing: Moderate   GO     Donavan Burnet, MS [facility] SLP [phone]

## 2013-02-07 NOTE — Progress Notes (Signed)
CSW following for return to [facility] SNF when medically ready. CSW has completed FL2 & will continue to follow and assist with return.    Unice Bailey, LCSW [facility] Clinical Social Worker cell #: [phone]

## 2013-02-07 NOTE — Progress Notes (Addendum)
Physical Therapy Treatment Patient Details Name: Carrie [name] MRN: [id number] DOB: 08/02/1925 Today's Date: 02/07/2013 Time: 4098-1191 PT Time Calculation (min): 28 min  PT Assessment / Plan / Recommendation Comments on Treatment Session  Pt continues to require +2 assist for mobility.  Did demo slightly improved sitting balance today intermittently.  Again, noted during mobility that her gown was wet and therefore assisted with rolling to clean pt.  Donned soft collar during session for better head control and position, esp once in chair.     Follow Up Recommendations  SNF;Supervision/Assistance - 24 hour     Does the patient have the potential to tolerate intense rehabilitation     Barriers to Discharge        Equipment Recommendations  None recommended by PT    Recommendations for Other Services    Frequency Min 2X/week   Plan Discharge plan remains appropriate;Frequency remains appropriate    Precautions / Restrictions Precautions Precautions: Fall Precaution Comments: posterior lean with sitting Restrictions Weight Bearing Restrictions: No   Pertinent Vitals/Pain Pt states some pain in LEs with mobility.     Mobility  Bed Mobility Bed Mobility: Rolling Right;Rolling Left;Left Sidelying to Sit;Sitting - Scoot to [place] of Bed Rolling Right: 1: +2 Total assist Rolling Right: Patient Percentage: 0% Rolling Left: 1: +2 Total assist Rolling Left: Patient Percentage: 10% Left Sidelying to Sit: 1: +2 Total assist Left Sidelying to Sit: Patient Percentage: 20% Details for Bed Mobility Assistance: Pt able to reach for handrail with some assist and she coult initiate rolling of trunk.  She does still require assist with LEs and for trunk to attain sitting.  Transfers Transfers: Heritage manager Transfers: 1: +2 Total assist Squat Pivot Transfers: Patient Percentage: 0% Details for Transfer Assistance: Pt requires total assist for transfer to chair and was  unable to WB through LEs.     Exercises     PT Diagnosis:    PT Problem List:   PT Treatment Interventions:     PT Goals Acute Rehab PT Goals PT Goal Formulation: With family Time For Goal Achievement: 02/10/13 Potential to Achieve Goals: Fair Pt will Roll Supine to Right Side: with max assist PT Goal: Rolling Supine to Right Side - Progress: Progressing toward goal Pt will Roll Supine to Left Side: with max assist PT Goal: Rolling Supine to Left Side - Progress: Progressing toward goal Pt will Sit at Edge of Bed: with mod assist;3-5 min;with bilateral upper extremity support PT Goal: Sit at Edge Of Bed - Progress: Progressing toward goal Additional Goals Additional Goal #1: Pt will demonstrate midline head control x 1 min for improved posture/scanning of environment.  PT Goal: Additional Goal #1 - Progress: Progressing toward goal  Visit Information  Last PT Received On: 02/07/13 Assistance Needed: +2    Subjective Data  Subjective: I don't feel well.    Cognition  Cognition Overall Cognitive Status: History of cognitive impairments - at baseline Arousal/Alertness: Lethargic Orientation Level: Disoriented to;Place;Time;Situation Behavior During Session: Curator Balance Assessed: Yes Static Sitting Balance Static Sitting - Balance Support: Bilateral upper extremity supported Static Sitting - Level of Assistance: 4: Min assist;3: Mod assist;2: Max assist;1: +1 Total assist Static Sitting - Comment/# of Minutes: Pt continues to require varied levels of assist with sitting.  she was able to sit at min assist for approx 20 secs, however continues to demonstrate severe posterior lean.   End of Session PT - End of Session  Equipment Utilized During Treatment: Oxygen Activity Tolerance: Patient limited by fatigue Patient left: in chair;with call bell/phone within reach Nurse Communication: Mobility status   GP     Vista Deck 02/07/2013, 12:05  PM

## 2013-02-07 NOTE — Progress Notes (Signed)
Provided brief support with pt while on rounds.

## 2013-02-07 NOTE — Progress Notes (Signed)
[facility] PROGRESS NOTE  Carrie [name] ZOX:[id number] DOB: 1925-11-24 DOA: 02/02/2013 PCP: No primary provider on file.  Brief History 79 year old female with a history of dementia, depression, CHF, hypertension and stroke presents with bradycardia and low oxygen saturation from her nursing home. The patient has dementia and is unable to provide any history whatsoever. She is on an oxygen saturation of 88% on RA. Workup has revealed proBNP 22,537 with chest x-ray suggestive of pulmonary vascular engorgement and increased interstitial changes when compared to previous chest x-rays in 2013. Initial WBC count was 11.9 with temperature of 99.39F. The patient was initially started on Zosyn because of concerns for pneumonia. However, the patient's procalcitonin has been 0.10 and <0.10. The patient was also noted to be hyperkalemic with potassium 6.7, serum creatinine 1.52 at the time of admission. The patient had been given 2 doses of Kayexalate with correction of her potassium. The patient was given furosemide 40 mg IV x1 dose and put out approximately 1200 cc of urine upon arrival to the floor. Repeat chest x-ray shows improvement of her pulmonary vascular engorgement, but continued bilateral lower lobe atelectasis/opacities. The patient has not been tachycardic nor febrile. Her Zosyn was discontinued after 24 hours on 02/04/2013. The patient has borderline low blood pressures since given furosemide ranging normally in the mid to upper 90s. However she has occasional blood pressures in the mid 80s, and received approximately 500 cc of fluid in total as a result of her mild hypotension. Assessment/Plan: Acute respiratory failure with hypoxemia -Due to acute on chronic diastolic CHF -Oxygen saturation has gradually improved -No respiratory distress presently over the past 48 hours -Oxygen saturation 95-90% on 2 L Acute on chronic diastolic CHF--predominantly right-sided heart failure -Echocardiogram EF  65-70% with septal flattening suggesting RV overload -Continue judicious furosemide. Hold furosemide dose today secondary to alkalosis. May need to decrease dose. -Serum creatinine has improved with diuresis -Strict I.'s and O.'s -Daily weights Ascites/Transaminasemia -Likely related to the patient's right heart failure resulting in chronic hepatic congestion -Will not pursue any aggressive measures including paracentesis to obtain SAAG -Dr Tat spoke with the patient's sister (POA) who does not want any invasive procedures at this time -trend liver enzymes -Continue judicious diuresis--lasix 40mg  po qday -Abdominal ultrasound shows thickened gallbladder--likely due to the patient's ascites--no clinical sign of cholecystitis on exam Head drop -Patient has not had muscle tone to her upper head, suspect underlying myopathy although may have been related partly to electrolyte abnormalities -CPK 271 -soft cervical collar during meals to lessen risk of aspiration -Speech has evaluated the patient and recommended dysphagia 2 diet -Neurology has been consult and feel patient's symptoms are likely metabolic in nature and no further neurological workup is needed at this time. -D/C gabapentin. -CT brain neg Hypokalemia/hyperkalemia Secondary to diuretics. Hyperkalemic likely secondary to aggressive replacement has since resolved. Follow.  Dysphagia -As discussed above -Modified barium swallow showed silent aspiration of thin liquids.  -Continue current diet. Klebsiella pneumonia Urinary tract infection Urine cultures with greater than 100,000 gram-negative rods. Pansensitive. Continue IV Rocephin. CODE STATUS -patient's sister has decided on patient been a DO NOT RESUSCITATE.    Family Communication:   Updated patient and sister AT BEDISDE.  Disposition Plan:   Back to SNF     Antibiotics:  Zosyn 02/03/2013>>> 02/04/2013  IV Rocephin 02/05/2013    Procedures/Studies: Ct Head Wo  Contrast  02/03/2013  *RADIOLOGY REPORT*  Clinical Data: Left arm weakness.  Encephalopathy.  CT HEAD WITHOUT CONTRAST  Technique:  Contiguous axial images were obtained from the base of the skull through the vertex without contrast.  Comparison: 01/10/2010  Findings: There is no acute intracranial hemorrhage, infarction, or mass lesion.  There is diffuse cerebral cortical and cerebellar atrophy.  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  Chronic mucosal thickening in the paranasal sinuses, stable.  IMPRESSION: No acute abnormality.  Diffuse atrophy.   Original Report Authenticated By: Francene Boyers, M.D.    US Abdomen Complete  02/03/2013  *RADIOLOGY REPORT*  Clinical Data:  Acute renal failure.  Elevated LFTs.  History of CHF, hypertension.  COMPLETE ABDOMINAL ULTRASOUND  Comparison:  CT of the abdomen and pelvis on 09/05/2009  Findings:  Gallbladder:  Gallbladder wall is thickened, 3.7 mm.  No gallstones or pericholecystic fluid identified.  No sonographic Murphy's sign.  Common bile duct:  4.6 mm, within normal limits.  Liver:  There is limited visualization of the lower.  Visualized aspect shows no focal lesion.  IVC:  Appears normal.  Pancreas:  There is limited visualization of the pancreas because of bowel gas.  Spleen:  The visualized portion of the spleen is normal in appearance, 5.3 cm in length.  Right Kidney:  There is parenchymal thinning of the right kidney. Length is 8.7 cm.  No focal mass or hydronephrosis.  Left Kidney:  9.7 cm in length.  No focal mass or hydronephrosis.  Abdominal aorta:  Abdominal aorta is 1.8 cm in diameter.  Additional findings:  Ascites and right pleural effusion are noted.  IMPRESSION:  1.  Thickened gallbladder wall which can be a nonspecific finding, often associated with ascites. 2.  No other evidence for acute cholecystitis. 3.  Limited evaluation of the liver and pancreas. 4.  Parenchymal thinning of the right kidney is felt to be a  chronic abnormality, but likely new since exam in 2010. Consider CT of the abdomen pelvis without contrast to evaluate for possible obstruction.   Original Report Authenticated By: Norva Pavlov, M.D.    Dg Chest Port 1 View  02/03/2013  *RADIOLOGY REPORT*  Clinical Data: Pulmonary infiltrates.  PORTABLE CHEST - 1 VIEW  Comparison: 02/02/2013 and 01/15/2010  Findings: The heart size and pulmonary vascularity are normal. There is minimal atelectasis in the right mid and lower lung zone and at the left base medially.  No consolidative infiltrates or effusions.  No acute osseous abnormality.  IMPRESSION: Slight atelectasis.   Original Report Authenticated By: Francene Boyers, M.D.    Dg Chest Portable 1 View  02/02/2013  *RADIOLOGY REPORT*  Clinical Data: Bradycardia, history of CHF  PORTABLE CHEST - 1 VIEW  Comparison: 01/15/2010  Findings: Cardiomegaly with pulmonary vascular congestion and suspected mild interstitial edema.  Mild patchy left basilar opacity, atelectasis versus pneumonia.  Underlying chronic interstitial markings.  No pneumothorax.  Left lung base is obscured by defibrillator pads.  IMPRESSION: Cardiomegaly with pulmonary vascular congestion and suspected mild interstitial edema.  Mild patchy left basilar opacity, atelectasis versus pneumonia.   Original Report Authenticated By: Charline Bills, M.D.          Subjective: Patient is mostly confused. She denies any headache, chest pain, shortness breath, abdominal pain. Per family patient more lethargic today.  Objective: Filed Vitals:   02/06/13 1431 02/06/13 2115 02/07/13 0500 02/07/13 0507  BP: 115/44 104/46  121/47  Pulse: 58 64  67  Temp: 97.8 F (36.6 C) 98.5 F (36.9 C)  98.2 F (36.8 C)  TempSrc: Axillary Oral  Oral  Resp: 18 18  20  Height:      Weight:   38 kg (83 lb 12.4 oz)   SpO2: 100% 98%  92%    Intake/Output Summary (Last 24 hours) at 02/07/13 1002 Last data filed at 02/07/13 0600  Gross per 24 hour   Intake    420 ml  Output      0 ml  Net    420 ml   Weight change: -0.3 kg (-10.6 oz) Exam:   General:  Pt is drowsy, intermittenly follows commands appropriately, not in acute distress  HEENT: No icterus, No thrush,  [place]/AT  Cardiovascular: RRR, S1/S2, no rubs, no gallops  Respiratory: Bibasilar crackles, recurrent left. No wheezes or rhonchi. Good air movement.  Abdomen: Soft/+BS, non tender, non distended, no guarding  Extremities:  No lymphangitis, No petechiae, No rashes, no synovitis  Data Reviewed: Basic Metabolic Panel:  Recent Labs Lab 02/02/13 1644 02/03/13 0005  02/04/13 0340 02/05/13 0442 02/05/13 1540 02/06/13 0408 02/06/13 0745 02/07/13 0450  NA 134* 134*  < > 139 142 140 142  --  142  K 6.7* 4.2  < > 3.3* 2.2* 3.3* 5.7* 5.2* 3.7  CL 99 93*  < > 98 96 96 103  --  99  CO2 20 28  < > 33* 39* 39* 36*  --  39*  GLUCOSE 88 144*  < > 106* 100* 104* 91  --  87  BUN 59* 59*  < > 44* 21 14 11   --  10  CREATININE 1.52* 1.42*  < > 1.01 0.65 0.47* 0.45*  --  0.46*  CALCIUM 8.3* 8.3*  < > 7.9* 8.0* 8.2* 8.0*  --  8.5  MG 2.0 2.0  --  2.0 1.8  --   --   --   --   PHOS  --  6.9*  --   --   --   --   --   --   --   < > = values in this interval not displayed. Liver Function Tests:  Recent Labs Lab 02/03/13 0005 02/03/13 0330 02/04/13 0340 02/05/13 0442 02/06/13 0408  AST 142* 130* 199* 156* 68*  ALT 120* 118* 205* 235* 158*  ALKPHOS 101 96 93 88 77  BILITOT 0.3 0.2* 0.3 0.3 0.2*  PROT 5.5* 5.2* 5.5* 5.6* 5.2*  ALBUMIN 2.9* 2.7* 2.7* 2.7* 2.4*   No results found for this basename: LIPASE, AMYLASE,  in the last 168 hours No results found for this basename: AMMONIA,  in the last 168 hours CBC:  Recent Labs Lab 02/02/13 1644 02/03/13 0005 02/03/13 0330 02/04/13 0340 02/05/13 0442 02/06/13 0408  WBC 11.9* 10.1 9.9 7.4 8.0 7.1  NEUTROABS 9.5* 8.2*  --   --   --   --   HGB 8.8* 8.6* 8.4* 8.8* 9.6* 9.1*  HCT 30.8* 30.2* 29.6* 31.6* 34.7* 33.0*  MCV  78.6 78.0 77.7* 78.2 80.7 81.3  PLT 352 296 271 239 235 217   Cardiac Enzymes:  Recent Labs Lab 02/02/13 1644 02/03/13 0925 02/03/13 1422 02/03/13 1423  CKTOTAL  --   --   --  271*  TROPONINI <0.30 <0.30 <0.30  --    BNP: No components found with this basename: POCBNP,  CBG:  Recent Labs Lab 02/03/13 0734 02/04/13 0745 02/05/13 0741 02/06/13 0737 02/07/13 0736  GLUCAP 89 119* 98 88 96    Recent Results (from the past 240 hour(s))  CULTURE, BLOOD (ROUTINE X 2)     Status: None  Collection Time    02/02/13  5:50 PM      Result Value Range Status   Specimen Description BLOOD RIGHT HAND   Final   Special Requests BOTTLES DRAWN AEROBIC AND ANAEROBIC 2.5CC EACH   Final   Culture  Setup Time 02/02/2013 23:28   Final   Culture     Final   Value:        BLOOD CULTURE RECEIVED NO GROWTH TO DATE CULTURE WILL BE HELD FOR 5 DAYS BEFORE ISSUING A FINAL NEGATIVE REPORT   Report Status PENDING   Incomplete  URINE CULTURE     Status: None   Collection Time    02/02/13  6:35 PM      Result Value Range Status   Specimen Description URINE, CATHETERIZED   Final   Special Requests NONE   Final   Culture  Setup Time 02/03/2013 01:27   Final   Colony Count >=100,000 COLONIES/ML   Final   Culture KLEBSIELLA PNEUMONIAE   Final   Report Status 02/06/2013 FINAL   Final   Organism ID, Bacteria KLEBSIELLA PNEUMONIAE   Final  CULTURE, BLOOD (ROUTINE X 2)     Status: None   Collection Time    02/02/13  6:42 PM      Result Value Range Status   Specimen Description BLOOD RIGHT ARM   Final   Special Requests BOTTLES DRAWN AEROBIC AND ANAEROBIC 4CC EACH   Final   Culture  Setup Time 02/02/2013 23:28   Final   Culture     Final   Value:        BLOOD CULTURE RECEIVED NO GROWTH TO DATE CULTURE WILL BE HELD FOR 5 DAYS BEFORE ISSUING A FINAL NEGATIVE REPORT   Report Status PENDING   Incomplete  MRSA PCR SCREENING     Status: None   Collection Time    02/02/13  8:54 PM      Result Value Range  Status   MRSA by PCR NEGATIVE  NEGATIVE Final   Comment:            The GeneXpert MRSA Assay (FDA     approved for NASAL specimens     only), is one component of a     comprehensive MRSA colonization     surveillance program. It is not     intended to diagnose MRSA     infection nor to guide or     monitor treatment for     MRSA infections.     Scheduled Meds: . antiseptic oral rinse  15 mL Mouth Rinse BID  . aspirin  81 mg Oral Daily  . calcium-vitamin D  1 tablet Oral Daily  . cefTRIAXone (ROCEPHIN)  IV  1 g Intravenous Q24H  . dextrose  1 ampule Intravenous Once  . docusate sodium  100 mg Oral BID  . enoxaparin (LOVENOX) injection  30 mg Subcutaneous Q24H  . escitalopram  10 mg Oral Daily  . furosemide  40 mg Oral Daily  . guaiFENesin  600 mg Oral BID  . insulin aspart  10 Units Intravenous Once  . mirtazapine  7.5 mg Oral QHS  . multivitamin with minerals  1 tablet Oral Daily  . polyvinyl alcohol  1 drop Both Eyes BID  . sodium chloride  3 mL Intravenous Q12H   Continuous Infusions: . sodium chloride       THOMPSON,DANIEL, MD  [facility] Pager [phone]  If 7PM-7AM, please contact night-coverage [email or web address] Password [facility] 02/07/2013, 10:02  AM   LOS: 5 days

## 2013-02-08 ENCOUNTER — Inpatient Hospital Stay (HOSPITAL_COMMUNITY): Payer: Medicare HMO

## 2013-02-08 LAB — CBC
Hemoglobin: 9.3 g/dL — ABNORMAL LOW (ref 12.0–15.0)
MCH: 22.2 pg — ABNORMAL LOW (ref 26.0–34.0)
MCHC: 27.6 g/dL — ABNORMAL LOW (ref 30.0–36.0)
RDW: 20.6 % — ABNORMAL HIGH (ref 11.5–15.5)

## 2013-02-08 LAB — GLUCOSE, CAPILLARY: Glucose-Capillary: 100 mg/dL — ABNORMAL HIGH (ref 70–99)

## 2013-02-08 LAB — COMPREHENSIVE METABOLIC PANEL
ALT: 89 U/L — ABNORMAL HIGH (ref 0–35)
Albumin: 2.5 g/dL — ABNORMAL LOW (ref 3.5–5.2)
Calcium: 8.9 mg/dL (ref 8.4–10.5)
GFR calc Af Amer: >90 mL/min (ref >90)
Glucose, Bld: 78 mg/dL (ref 70–99)
Sodium: 141 mEq/L (ref 135–145)
Total Protein: 5.2 g/dL — ABNORMAL LOW (ref 6.0–8.3)

## 2013-02-08 LAB — ALDOLASE: Aldolase: 19.7 U/L — ABNORMAL HIGH (ref <=8.1)

## 2013-02-08 MED ORDER — LORAZEPAM 2 MG/ML IJ SOLN
0.2500 mg | Freq: Once | INTRAMUSCULAR | Status: AC
  Administered 2013-02-08: 0.25 mg via INTRAVENOUS
  Filled 2013-02-08: qty 1

## 2013-02-08 MED ORDER — POTASSIUM CHLORIDE CRYS ER 20 MEQ PO TBCR
20.0000 meq | EXTENDED_RELEASE_TABLET | Freq: Once | ORAL | Status: AC
  Administered 2013-02-08: 20 meq via ORAL
  Filled 2013-02-08: qty 1

## 2013-02-08 MED ORDER — FUROSEMIDE 20 MG PO TABS
20.0000 mg | ORAL_TABLET | Freq: Every day | ORAL | Status: DC
  Administered 2013-02-10 – 2013-02-11 (×2): 20 mg via ORAL
  Filled 2013-02-08 (×4): qty 1

## 2013-02-08 MED ORDER — FLEET ENEMA 7-19 GM/118ML RE ENEM
1.0000 | ENEMA | Freq: Once | RECTAL | Status: AC
  Administered 2013-02-08: 1 via RECTAL
  Filled 2013-02-08: qty 1

## 2013-02-08 NOTE — Progress Notes (Signed)
[facility] PROGRESS NOTE  Carrie [name] ZOX:[id number] DOB: 1925-02-10 DOA: 02/02/2013 PCP: No primary provider on file.  Brief History 79 year old female with a history of dementia, depression, CHF, hypertension and stroke presents with bradycardia and low oxygen saturation from her nursing home. The patient has dementia and is unable to provide any history whatsoever. She is on an oxygen saturation of 88% on RA. Workup has revealed proBNP 22,537 with chest x-ray suggestive of pulmonary vascular engorgement and increased interstitial changes when compared to previous chest x-rays in 2013. Initial WBC count was 11.9 with temperature of 99.68F. The patient was initially started on Zosyn because of concerns for pneumonia. However, the patient's procalcitonin has been 0.10 and <0.10. The patient was also noted to be hyperkalemic with potassium 6.7, serum creatinine 1.52 at the time of admission. The patient had been given 2 doses of Kayexalate with correction of her potassium. The patient was given furosemide 40 mg IV x1 dose and put out approximately 1200 cc of urine upon arrival to the floor. Repeat chest x-ray shows improvement of her pulmonary vascular engorgement, but continued bilateral lower lobe atelectasis/opacities. The patient has not been tachycardic nor febrile. Her Zosyn was discontinued after 24 hours on 02/04/2013. The patient has borderline low blood pressures since given furosemide ranging normally in the mid to upper 90s. However she has occasional blood pressures in the mid 80s, and received approximately 500 cc of fluid in total as a result of her mild hypotension. Assessment/Plan: Acute respiratory failure with hypoxemia -Due to acute on chronic diastolic CHF -Oxygen saturation has gradually improved -No respiratory distress presently over the past 48 hours -Oxygen saturation 95-90% on 2 L Acute on chronic diastolic CHF--predominantly right-sided heart failure -Echocardiogram EF  65-70% with septal flattening suggesting RV overload -Continue judicious furosemide. Hold furosemide dose today secondary to alkalosis. May need to decrease dose. -Serum creatinine has improved with diuresis -Strict I.'s and O.'s -Daily weights Ascites/Transaminasemia -Likely related to the patient's right heart failure resulting in chronic hepatic congestion -Will not pursue any aggressive measures including paracentesis to obtain SAAG -Dr Tat spoke with the patient's sister (POA) who does not want any invasive procedures at this time -trend liver enzymes -Continue judicious diuresis--lasix 40mg  po qday -Abdominal ultrasound shows thickened gallbladder--likely due to the patient's ascites--no clinical sign of cholecystitis on exam Head drop -Patient has not had muscle tone to her upper head, suspect underlying myopathy although may have been related partly to electrolyte abnormalities -CPK 271 -soft cervical collar during meals to lessen risk of aspiration -Speech has evaluated the patient and recommended dysphagia 2 diet -Neurology has been consult and feel patient's symptoms are likely metabolic in nature and no further neurological workup is needed at this time. -D/C gabapentin. -CT brain neg Hypokalemia/hyperkalemia Secondary to diuretics. Hyperkalemic likely secondary to aggressive replacement has since resolved. Follow.  Dysphagia -As discussed above -Modified barium swallow showed silent aspiration of thin liquids.  -Continue current diet. Klebsiella pneumonia Urinary tract infection Urine cultures with greater than 100,000 gram-negative rods. Pansensitive. Continue IV Rocephin. Abdominal pain Will get a KUB. Follow. CODE STATUS -patient's sister has decided on patient been a DO NOT RESUSCITATE.    Family Communication:   Updated patient and sister AT BEDISDE.  Disposition Plan:   Back to SNF     Antibiotics:  Zosyn 02/03/2013>>> 02/04/2013  IV Rocephin  02/05/2013    Procedures/Studies: Ct Head Wo Contrast  02/03/2013  *RADIOLOGY REPORT*  Clinical Data: Left arm weakness.  Encephalopathy.  CT HEAD WITHOUT CONTRAST  Technique:  Contiguous axial images were obtained from the base of the skull through the vertex without contrast.  Comparison: 01/10/2010  Findings: There is no acute intracranial hemorrhage, infarction, or mass lesion.  There is diffuse cerebral cortical and cerebellar atrophy.  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  Chronic mucosal thickening in the paranasal sinuses, stable.  IMPRESSION: No acute abnormality.  Diffuse atrophy.   Original Report Authenticated By: Francene Boyers, M.D.    US Abdomen Complete  02/03/2013  *RADIOLOGY REPORT*  Clinical Data:  Acute renal failure.  Elevated LFTs.  History of CHF, hypertension.  COMPLETE ABDOMINAL ULTRASOUND  Comparison:  CT of the abdomen and pelvis on 09/05/2009  Findings:  Gallbladder:  Gallbladder wall is thickened, 3.7 mm.  No gallstones or pericholecystic fluid identified.  No sonographic Murphy's sign.  Common bile duct:  4.6 mm, within normal limits.  Liver:  There is limited visualization of the lower.  Visualized aspect shows no focal lesion.  IVC:  Appears normal.  Pancreas:  There is limited visualization of the pancreas because of bowel gas.  Spleen:  The visualized portion of the spleen is normal in appearance, 5.3 cm in length.  Right Kidney:  There is parenchymal thinning of the right kidney. Length is 8.7 cm.  No focal mass or hydronephrosis.  Left Kidney:  9.7 cm in length.  No focal mass or hydronephrosis.  Abdominal aorta:  Abdominal aorta is 1.8 cm in diameter.  Additional findings:  Ascites and right pleural effusion are noted.  IMPRESSION:  1.  Thickened gallbladder wall which can be a nonspecific finding, often associated with ascites. 2.  No other evidence for acute cholecystitis. 3.  Limited evaluation of the liver and pancreas. 4.  Parenchymal  thinning of the right kidney is felt to be a chronic abnormality, but likely new since exam in 2010. Consider CT of the abdomen pelvis without contrast to evaluate for possible obstruction.   Original Report Authenticated By: Norva Pavlov, M.D.    Dg Chest Port 1 View  02/03/2013  *RADIOLOGY REPORT*  Clinical Data: Pulmonary infiltrates.  PORTABLE CHEST - 1 VIEW  Comparison: 02/02/2013 and 01/15/2010  Findings: The heart size and pulmonary vascularity are normal. There is minimal atelectasis in the right mid and lower lung zone and at the left base medially.  No consolidative infiltrates or effusions.  No acute osseous abnormality.  IMPRESSION: Slight atelectasis.   Original Report Authenticated By: Francene Boyers, M.D.    Dg Chest Portable 1 View  02/02/2013  *RADIOLOGY REPORT*  Clinical Data: Bradycardia, history of CHF  PORTABLE CHEST - 1 VIEW  Comparison: 01/15/2010  Findings: Cardiomegaly with pulmonary vascular congestion and suspected mild interstitial edema.  Mild patchy left basilar opacity, atelectasis versus pneumonia.  Underlying chronic interstitial markings.  No pneumothorax.  Left lung base is obscured by defibrillator pads.  IMPRESSION: Cardiomegaly with pulmonary vascular congestion and suspected mild interstitial edema.  Mild patchy left basilar opacity, atelectasis versus pneumonia.   Original Report Authenticated By: Charline Bills, M.D.          Subjective: Patient is mostly confused. She denies any headache, chest pain, shortness breath, abdominal pain. Per family patient more alert today. Patient has been states patient complaining of abdominal pain.  Objective: Filed Vitals:   02/07/13 0507 02/07/13 1540 02/07/13 2049 02/08/13 0520  BP: 121/47 121/45 129/49 121/50  Pulse: 67 65 62 62  Temp: 98.2 F (36.8 C) 98.4 F (36.9 C)  97.6 F (36.4 C) 98.2 F (36.8 C)  TempSrc: Oral Axillary Oral Oral  Resp: 20 18 16 16   Height:      Weight:    38.2 kg (84 lb 3.5 oz)   SpO2: 92% 96% 97% 96%    Intake/Output Summary (Last 24 hours) at 02/08/13 1346 Last data filed at 02/08/13 0909  Gross per 24 hour  Intake    670 ml  Output      0 ml  Net    670 ml   Weight change: 0.2 kg (7.1 oz) Exam:   General:  Pt is more alert, intermittenly follows commands appropriately, not in acute distress  HEENT: No icterus, No thrush,  Eddy/AT  Cardiovascular: RRR, S1/S2, no rubs, no gallops  Respiratory: Bibasilar crackles, recurrent left. No wheezes or rhonchi. Good air movement.  Abdomen: Soft/+BS, non tender, non distended, no guarding  Extremities:  No lymphangitis, No petechiae, No rashes, no synovitis  Data Reviewed: Basic Metabolic Panel:  Recent Labs Lab 02/02/13 1644 02/03/13 0005  02/04/13 0340 02/05/13 0442 02/05/13 1540 02/06/13 0408 02/06/13 0745 02/07/13 0450 02/08/13 0428  NA 134* 134*  < > 139 142 140 142  --  142 141  K 6.7* 4.2  < > 3.3* 2.2* 3.3* 5.7* 5.2* 3.7 3.4*  CL 99 93*  < > 98 96 96 103  --  99 98  CO2 20 28  < > 33* 39* 39* 36*  --  39* 40*  GLUCOSE 88 144*  < > 106* 100* 104* 91  --  87 78  BUN 59* 59*  < > 44* 21 14 11   --  10 9  CREATININE 1.52* 1.42*  < > 1.01 0.65 0.47* 0.45*  --  0.46* 0.45*  CALCIUM 8.3* 8.3*  < > 7.9* 8.0* 8.2* 8.0*  --  8.5 8.9  MG 2.0 2.0  --  2.0 1.8  --   --   --   --   --   PHOS  --  6.9*  --   --   --   --   --   --   --   --   < > = values in this interval not displayed. Liver Function Tests:  Recent Labs Lab 02/03/13 0330 02/04/13 0340 02/05/13 0442 02/06/13 0408 02/08/13 0428  AST 130* 199* 156* 68* 30  ALT 118* 205* 235* 158* 89*  ALKPHOS 96 93 88 77 82  BILITOT 0.2* 0.3 0.3 0.2* 0.3  PROT 5.2* 5.5* 5.6* 5.2* 5.2*  ALBUMIN 2.7* 2.7* 2.7* 2.4* 2.5*   No results found for this basename: LIPASE, AMYLASE,  in the last 168 hours No results found for this basename: AMMONIA,  in the last 168 hours CBC:  Recent Labs Lab 02/02/13 1644 02/03/13 0005 02/03/13 0330  02/04/13 0340 02/05/13 0442 02/06/13 0408 02/08/13 0428  WBC 11.9* 10.1 9.9 7.4 8.0 7.1 8.7  NEUTROABS 9.5* 8.2*  --   --   --   --   --   HGB 8.8* 8.6* 8.4* 8.8* 9.6* 9.1* 9.3*  HCT 30.8* 30.2* 29.6* 31.6* 34.7* 33.0* 33.7*  MCV 78.6 78.0 77.7* 78.2 80.7 81.3 80.6  PLT 352 296 271 239 235 217 209   Cardiac Enzymes:  Recent Labs Lab 02/02/13 1644 02/03/13 0925 02/03/13 1422 02/03/13 1423  CKTOTAL  --   --   --  271*  TROPONINI <0.30 <0.30 <0.30  --    BNP: No components found with this basename: POCBNP,  CBG:  Recent Labs Lab 02/04/13 0745 02/05/13 0741 02/06/13 0737 02/07/13 0736 02/08/13 0740  GLUCAP 119* 98 88 96 100*    Recent Results (from the past 240 hour(s))  CULTURE, BLOOD (ROUTINE X 2)     Status: None   Collection Time    02/02/13  5:50 PM      Result Value Range Status   Specimen Description BLOOD RIGHT HAND   Final   Special Requests BOTTLES DRAWN AEROBIC AND ANAEROBIC 2.5CC EACH   Final   Culture  Setup Time 02/02/2013 23:28   Final   Culture     Final   Value:        BLOOD CULTURE RECEIVED NO GROWTH TO DATE CULTURE WILL BE HELD FOR 5 DAYS BEFORE ISSUING A FINAL NEGATIVE REPORT   Report Status PENDING   Incomplete  URINE CULTURE     Status: None   Collection Time    02/02/13  6:35 PM      Result Value Range Status   Specimen Description URINE, CATHETERIZED   Final   Special Requests NONE   Final   Culture  Setup Time 02/03/2013 01:27   Final   Colony Count >=100,000 COLONIES/ML   Final   Culture KLEBSIELLA PNEUMONIAE   Final   Report Status 02/06/2013 FINAL   Final   Organism ID, Bacteria KLEBSIELLA PNEUMONIAE   Final  CULTURE, BLOOD (ROUTINE X 2)     Status: None   Collection Time    02/02/13  6:42 PM      Result Value Range Status   Specimen Description BLOOD RIGHT ARM   Final   Special Requests BOTTLES DRAWN AEROBIC AND ANAEROBIC 4CC EACH   Final   Culture  Setup Time 02/02/2013 23:28   Final   Culture     Final   Value:        BLOOD  CULTURE RECEIVED NO GROWTH TO DATE CULTURE WILL BE HELD FOR 5 DAYS BEFORE ISSUING A FINAL NEGATIVE REPORT   Report Status PENDING   Incomplete  MRSA PCR SCREENING     Status: None   Collection Time    02/02/13  8:54 PM      Result Value Range Status   MRSA by PCR NEGATIVE  NEGATIVE Final   Comment:            The GeneXpert MRSA Assay (FDA     approved for NASAL specimens     only), is one component of a     comprehensive MRSA colonization     surveillance program. It is not     intended to diagnose MRSA     infection nor to guide or     monitor treatment for     MRSA infections.     Scheduled Meds: . antiseptic oral rinse  15 mL Mouth Rinse BID  . aspirin  81 mg Oral Daily  . calcium-vitamin D  1 tablet Oral Daily  . cefTRIAXone (ROCEPHIN)  IV  1 g Intravenous Q24H  . dextrose  1 ampule Intravenous Once  . docusate sodium  100 mg Oral BID  . enoxaparin (LOVENOX) injection  30 mg Subcutaneous Q24H  . escitalopram  10 mg Oral Daily  . furosemide  20 mg Oral Daily  . guaiFENesin  600 mg Oral BID  . insulin aspart  10 Units Intravenous Once  . mirtazapine  7.5 mg Oral QHS  . multivitamin with minerals  1 tablet Oral Daily  . polyvinyl alcohol  1 drop Both Eyes BID  . sodium chloride  3 mL Intravenous Q12H   Continuous Infusions: . sodium chloride       Rashun Grattan, MD  [facility] Pager [phone]  If 7PM-7AM, please contact night-coverage [email or web address] Password [email or web address] 02/08/2013, 1:46 PM   LOS: 6 days

## 2013-02-08 NOTE — Progress Notes (Signed)
CRITICAL VALUE ALERT  Critical value received:  CO2 40  Date of notification:  02-08-2013  Time of notification:  0550am  Critical value read back:yes  Nurse who received alert:  C. Gerilyn Pilgrim, RN  MD notified (1st page):  M. Burnadette Peter, NP  Time of first page:  0615am  MD notified (2nd page):  Time of second page:  Responding MD:  M. Burnadette Peter, NP  Time MD responded:  [phone]

## 2013-02-09 DIAGNOSIS — R52 Pain, unspecified: Secondary | ICD-10-CM

## 2013-02-09 DIAGNOSIS — K5900 Constipation, unspecified: Secondary | ICD-10-CM

## 2013-02-09 DIAGNOSIS — F411 Generalized anxiety disorder: Secondary | ICD-10-CM

## 2013-02-09 DIAGNOSIS — K5909 Other constipation: Secondary | ICD-10-CM | POA: Diagnosis present

## 2013-02-09 LAB — BASIC METABOLIC PANEL
BUN: 9 mg/dL (ref 6–23)
CO2: 32 mEq/L (ref 19–32)
Chloride: 101 mEq/L (ref 96–112)
GFR calc Af Amer: >90 mL/min (ref >90)
Potassium: 3.7 mEq/L (ref 3.5–5.1)

## 2013-02-09 LAB — CBC
HCT: 35.5 % — ABNORMAL LOW (ref 36.0–46.0)
Hemoglobin: 9.7 g/dL — ABNORMAL LOW (ref 12.0–15.0)
WBC: 9.4 10*3/uL (ref 4.0–10.5)

## 2013-02-09 MED ORDER — LORAZEPAM 0.5 MG PO TABS: 0.5000 mg | ORAL_TABLET | Freq: Four times a day (QID) | ORAL | Status: DC | PRN

## 2013-02-09 MED ORDER — FLEET ENEMA 7-19 GM/118ML RE ENEM
1.0000 | ENEMA | Freq: Once | RECTAL | Status: AC
  Administered 2013-02-09: 1 via RECTAL
  Filled 2013-02-09: qty 1

## 2013-02-09 MED ORDER — BISACODYL 10 MG RE SUPP: 10.0000 mg | Freq: Every day | RECTAL | Status: DC | PRN

## 2013-02-09 NOTE — Progress Notes (Signed)
[facility] PROGRESS NOTE  Carrie [name] ZOX:[id number] DOB: Dec 11, 1924 DOA: 02/02/2013 PCP: No primary provider on file.  Brief History 79 year old female with a history of dementia, depression, CHF, hypertension and stroke presents with bradycardia and low oxygen saturation from her nursing home. The patient has dementia and is unable to provide any history whatsoever. She is on an oxygen saturation of 88% on RA. Workup has revealed proBNP 22,537 with chest x-ray suggestive of pulmonary vascular engorgement and increased interstitial changes when compared to previous chest x-rays in 2013. Initial WBC count was 11.9 with temperature of 99.26F. The patient was initially started on Zosyn because of concerns for pneumonia. However, the patient's procalcitonin has been 0.10 and <0.10. The patient was also noted to be hyperkalemic with potassium 6.7, serum creatinine 1.52 at the time of admission. The patient had been given 2 doses of Kayexalate with correction of her potassium. The patient was given furosemide 40 mg IV x1 dose and put out approximately 1200 cc of urine upon arrival to the floor. Repeat chest x-ray shows improvement of her pulmonary vascular engorgement, but continued bilateral lower lobe atelectasis/opacities. The patient has not been tachycardic nor febrile. Her Zosyn was discontinued after 24 hours on 02/04/2013. The patient has borderline low blood pressures since given furosemide ranging normally in the mid to upper 90s. However she has occasional blood pressures in the mid 80s, and received approximately 500 cc of fluid in total as a result of her mild hypotension. Assessment/Plan: Acute respiratory failure with hypoxemia -Due to acute on chronic diastolic CHF -Oxygen saturation has gradually improved -No respiratory distress presently over the past 48 hours -Oxygen saturation 95-90% on 2 L Acute on chronic diastolic CHF--predominantly right-sided heart failure -Echocardiogram EF  65-70% with septal flattening suggesting RV overload -Continue judicious furosemide. Hold furosemide dose today secondary to alkalosis. May need to decrease dose to lasix 20mg  daily tomorrow. -Serum creatinine has improved with diuresis -Strict I.'s and O.'s -Daily weights Ascites/Transaminasemia -Likely related to the patient's right heart failure resulting in chronic hepatic congestion -Will not pursue any aggressive measures including paracentesis to obtain SAAG -Dr Tat spoke with the patient's sister (POA) who does not want any invasive procedures at this time -trend liver enzymes -Continue judicious diuresis--lasix 20mg  po qday -Abdominal ultrasound shows thickened gallbladder--likely due to the patient's ascites--no clinical sign of cholecystitis on exam Head drop -Patient has not had muscle tone to her upper head, suspect underlying myopathy although may have been related partly to electrolyte abnormalities -CPK 271 -soft cervical collar during meals to lessen risk of aspiration -Speech has evaluated the patient and recommended dysphagia 2 diet -Neurology has been consult and feel patient's symptoms are likely metabolic in nature and no further neurological workup is needed at this time. -D/C gabapentin. -CT brain neg Hypokalemia/hyperkalemia Secondary to diuretics. Hyperkalemic likely secondary to aggressive replacement, has since resolved. Follow.  Dysphagia -As discussed above -Modified barium swallow showed silent aspiration of thin liquids.  -Continue current diet. Klebsiella pneumonia Urinary tract infection Urine cultures with greater than 100,000 gram-negative rods. Pansensitive. Continue IV Rocephin D5/7. Abdominal pain/CONSTIPATION Patient per nursing with good BM yesterday after enema. Will give another enema today. Follow Prognosis Patient somewhat lethargic however opens eyes to verbal stimuli and ff some commands. Will consult with palliative care medicine for  goals of care. CODE STATUS -patient's sister has decided on patient been a DO NOT RESUSCITATE.    Family Communication:   Updated patient and sister AT BEDISDE.  Disposition Plan:  Back to SNF     Antibiotics:  Zosyn 02/03/2013>>> 02/04/2013  IV Rocephin 02/05/2013    Procedures/Studies: Ct Head Wo Contrast  02/03/2013  *RADIOLOGY REPORT*  Clinical Data: Left arm weakness.  Encephalopathy.  CT HEAD WITHOUT CONTRAST  Technique:  Contiguous axial images were obtained from the base of the skull through the vertex without contrast.  Comparison: 01/10/2010  Findings: There is no acute intracranial hemorrhage, infarction, or mass lesion.  There is diffuse cerebral cortical and cerebellar atrophy.  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  Chronic mucosal thickening in the paranasal sinuses, stable.  IMPRESSION: No acute abnormality.  Diffuse atrophy.   Original Report Authenticated By: Francene Boyers, M.D.    US Abdomen Complete  02/03/2013  *RADIOLOGY REPORT*  Clinical Data:  Acute renal failure.  Elevated LFTs.  History of CHF, hypertension.  COMPLETE ABDOMINAL ULTRASOUND  Comparison:  CT of the abdomen and pelvis on 09/05/2009  Findings:  Gallbladder:  Gallbladder wall is thickened, 3.7 mm.  No gallstones or pericholecystic fluid identified.  No sonographic Murphy's sign.  Common bile duct:  4.6 mm, within normal limits.  Liver:  There is limited visualization of the lower.  Visualized aspect shows no focal lesion.  IVC:  Appears normal.  Pancreas:  There is limited visualization of the pancreas because of bowel gas.  Spleen:  The visualized portion of the spleen is normal in appearance, 5.3 cm in length.  Right Kidney:  There is parenchymal thinning of the right kidney. Length is 8.7 cm.  No focal mass or hydronephrosis.  Left Kidney:  9.7 cm in length.  No focal mass or hydronephrosis.  Abdominal aorta:  Abdominal aorta is 1.8 cm in diameter.  Additional findings:   Ascites and right pleural effusion are noted.  IMPRESSION:  1.  Thickened gallbladder wall which can be a nonspecific finding, often associated with ascites. 2.  No other evidence for acute cholecystitis. 3.  Limited evaluation of the liver and pancreas. 4.  Parenchymal thinning of the right kidney is felt to be a chronic abnormality, but likely new since exam in 2010. Consider CT of the abdomen pelvis without contrast to evaluate for possible obstruction.   Original Report Authenticated By: Norva Pavlov, M.D.    Dg Chest Port 1 View  02/03/2013  *RADIOLOGY REPORT*  Clinical Data: Pulmonary infiltrates.  PORTABLE CHEST - 1 VIEW  Comparison: 02/02/2013 and 01/15/2010  Findings: The heart size and pulmonary vascularity are normal. There is minimal atelectasis in the right mid and lower lung zone and at the left base medially.  No consolidative infiltrates or effusions.  No acute osseous abnormality.  IMPRESSION: Slight atelectasis.   Original Report Authenticated By: Francene Boyers, M.D.    Dg Chest Portable 1 View  02/02/2013  *RADIOLOGY REPORT*  Clinical Data: Bradycardia, history of CHF  PORTABLE CHEST - 1 VIEW  Comparison: 01/15/2010  Findings: Cardiomegaly with pulmonary vascular congestion and suspected mild interstitial edema.  Mild patchy left basilar opacity, atelectasis versus pneumonia.  Underlying chronic interstitial markings.  No pneumothorax.  Left lung base is obscured by defibrillator pads.  IMPRESSION: Cardiomegaly with pulmonary vascular congestion and suspected mild interstitial edema.  Mild patchy left basilar opacity, atelectasis versus pneumonia.   Original Report Authenticated By: Charline Bills, M.D.          Subjective: Patient is sleeping however opens eyes to verbal stimuli. She denies any headache, chest pain, shortness breath, abdominal pain.  Objective: Filed Vitals:   02/07/13 2049  02/08/13 0520 02/08/13 2134 02/09/13 0512  BP: 129/49 121/50 124/55 136/57  Pulse:  62 62 62 69  Temp: 97.6 F (36.4 C) 98.2 F (36.8 C) 98.8 F (37.1 C) 97.8 F (36.6 C)  TempSrc: Oral Oral Oral Oral  Resp: 16 16 16 18   Height:      Weight:  38.2 kg (84 lb 3.5 oz)  36.2 kg (79 lb 12.9 oz)  SpO2: 97% 96% 94% 91%    Intake/Output Summary (Last 24 hours) at 02/09/13 0926 Last data filed at 02/08/13 1712  Gross per 24 hour  Intake    120 ml  Output      0 ml  Net    120 ml   Weight change: -2 kg (-4 lb 6.6 oz) Exam:   General:  Pt is sleeping, opens eyes to verbal stimuli, intermittenly follows commands appropriately, not in acute distress  HEENT: No icterus, No thrush,  [place]/AT  Cardiovascular: RRR, S1/S2, no rubs, no gallops  Respiratory: CTAB in anterior lung fields. No wheezes or rhonchi. Good air movement.  Abdomen: Soft/+BS, non tender, non distended, no guarding  Extremities:  No lymphangitis, No petechiae, No rashes, no synovitis  Data Reviewed: Basic Metabolic Panel:  Recent Labs Lab 02/02/13 1644 02/03/13 0005  02/04/13 0340 02/05/13 0442 02/05/13 1540 02/06/13 0408 02/06/13 0745 02/07/13 0450 02/08/13 0428 02/09/13 0443  NA 134* 134*  < > 139 142 140 142  --  142 141 142  K 6.7* 4.2  < > 3.3* 2.2* 3.3* 5.7* 5.2* 3.7 3.4* 3.7  CL 99 93*  < > 98 96 96 103  --  99 98 101  CO2 20 28  < > 33* 39* 39* 36*  --  39* 40* 32  GLUCOSE 88 144*  < > 106* 100* 104* 91  --  87 78 75  BUN 59* 59*  < > 44* 21 14 11   --  10 9 9   CREATININE 1.52* 1.42*  < > 1.01 0.65 0.47* 0.45*  --  0.46* 0.45* 0.47*  CALCIUM 8.3* 8.3*  < > 7.9* 8.0* 8.2* 8.0*  --  8.5 8.9 8.9  MG 2.0 2.0  --  2.0 1.8  --   --   --   --   --   --   PHOS  --  6.9*  --   --   --   --   --   --   --   --   --   < > = values in this interval not displayed. Liver Function Tests:  Recent Labs Lab 02/03/13 0330 02/04/13 0340 02/05/13 0442 02/06/13 0408 02/08/13 0428  AST 130* 199* 156* 68* 30  ALT 118* 205* 235* 158* 89*  ALKPHOS 96 93 88 77 82  BILITOT 0.2* 0.3 0.3 0.2* 0.3   PROT 5.2* 5.5* 5.6* 5.2* 5.2*  ALBUMIN 2.7* 2.7* 2.7* 2.4* 2.5*   No results found for this basename: LIPASE, AMYLASE,  in the last 168 hours No results found for this basename: AMMONIA,  in the last 168 hours CBC:  Recent Labs Lab 02/02/13 1644 02/03/13 0005  02/04/13 0340 02/05/13 0442 02/06/13 0408 02/08/13 0428 02/09/13 0443  WBC 11.9* 10.1  < > 7.4 8.0 7.1 8.7 9.4  NEUTROABS 9.5* 8.2*  --   --   --   --   --   --   HGB 8.8* 8.6*  < > 8.8* 9.6* 9.1* 9.3* 9.7*  HCT 30.8* 30.2*  < >  31.6* 34.7* 33.0* 33.7* 35.5*  MCV 78.6 78.0  < > 78.2 80.7 81.3 80.6 80.7  PLT 352 296  < > 239 235 217 209 255  < > = values in this interval not displayed. Cardiac Enzymes:  Recent Labs Lab 02/02/13 1644 02/03/13 0925 02/03/13 1422 02/03/13 1423  CKTOTAL  --   --   --  271*  TROPONINI <0.30 <0.30 <0.30  --    BNP: No components found with this basename: POCBNP,  CBG:  Recent Labs Lab 02/05/13 0741 02/06/13 0737 02/07/13 0736 02/08/13 0740 02/09/13 0758  GLUCAP 98 88 96 100* 78    Recent Results (from the past 240 hour(s))  CULTURE, BLOOD (ROUTINE X 2)     Status: None   Collection Time    02/02/13  5:50 PM      Result Value Range Status   Specimen Description BLOOD RIGHT HAND   Final   Special Requests BOTTLES DRAWN AEROBIC AND ANAEROBIC 2.5CC EACH   Final   Culture  Setup Time 02/02/2013 23:28   Final   Culture     Final   Value:        BLOOD CULTURE RECEIVED NO GROWTH TO DATE CULTURE WILL BE HELD FOR 5 DAYS BEFORE ISSUING A FINAL NEGATIVE REPORT   Report Status PENDING   Incomplete  URINE CULTURE     Status: None   Collection Time    02/02/13  6:35 PM      Result Value Range Status   Specimen Description URINE, CATHETERIZED   Final   Special Requests NONE   Final   Culture  Setup Time 02/03/2013 01:27   Final   Colony Count >=100,000 COLONIES/ML   Final   Culture KLEBSIELLA PNEUMONIAE   Final   Report Status 02/06/2013 FINAL   Final   Organism ID, Bacteria  KLEBSIELLA PNEUMONIAE   Final  CULTURE, BLOOD (ROUTINE X 2)     Status: None   Collection Time    02/02/13  6:42 PM      Result Value Range Status   Specimen Description BLOOD RIGHT ARM   Final   Special Requests BOTTLES DRAWN AEROBIC AND ANAEROBIC 4CC EACH   Final   Culture  Setup Time 02/02/2013 23:28   Final   Culture     Final   Value:        BLOOD CULTURE RECEIVED NO GROWTH TO DATE CULTURE WILL BE HELD FOR 5 DAYS BEFORE ISSUING A FINAL NEGATIVE REPORT   Report Status PENDING   Incomplete  MRSA PCR SCREENING     Status: None   Collection Time    02/02/13  8:54 PM      Result Value Range Status   MRSA by PCR NEGATIVE  NEGATIVE Final   Comment:            The GeneXpert MRSA Assay (FDA     approved for NASAL specimens     only), is one component of a     comprehensive MRSA colonization     surveillance program. It is not     intended to diagnose MRSA     infection nor to guide or     monitor treatment for     MRSA infections.     Scheduled Meds: . antiseptic oral rinse  15 mL Mouth Rinse BID  . aspirin  81 mg Oral Daily  . calcium-vitamin D  1 tablet Oral Daily  . cefTRIAXone (ROCEPHIN)  IV  1 g  Intravenous Q24H  . dextrose  1 ampule Intravenous Once  . docusate sodium  100 mg Oral BID  . enoxaparin (LOVENOX) injection  30 mg Subcutaneous Q24H  . escitalopram  10 mg Oral Daily  . furosemide  20 mg Oral Daily  . guaiFENesin  600 mg Oral BID  . insulin aspart  10 Units Intravenous Once  . mirtazapine  7.5 mg Oral QHS  . multivitamin with minerals  1 tablet Oral Daily  . polyvinyl alcohol  1 drop Both Eyes BID  . sodium chloride  3 mL Intravenous Q12H   Continuous Infusions: . sodium chloride       Carrie Harmes, MD  [facility] Pager [phone]  If 7PM-7AM, please contact night-coverage [email or web address] Password [email or web address] 02/09/2013, 9:26 AM   LOS: 7 days

## 2013-02-09 NOTE — Consult Note (Signed)
Patient Carrie [name]      DOB: 1925/11/07      QIO:[id number]     Consult Note from the Palliative Medicine Team at [facility]    Consult Requested by: Dr Janee Morn     PCP: No primary provider on file. Reason for Consultation:Clarification of GCO and options     Phone Number:None  Assessment of patients Current state: 79 yo female with h/o dementia, depression, CHF, HTN---overall failure to thrive in SNF.  Non ambulatory, incontinent, poor po intake.  Admitted with respiratory failure, UTI Family faced with decsions for future care needs  Consult is for review of medical treatment options, clarification of goals of care and end of life issues, disposition and options, and symptom recommendation.  This NP Lorinda Creed reviewed medical records, received report from team, assessed the patient and then meet at the patient's bedside along with her sister Hinda Lenis, # [phone] and two nieces, Lynford Humphrey # [phone], YQIHKVQ QVZDGLO # [phone]  to discuss diagnosis prognosis, GOC, EOL wishes disposition and options.   A detailed discussion was had today regarding advanced directives.  Concepts specific to code status, artifical feeding and hydration, continued IV antibiotics and rehospitalization was had.  The difference between a aggressive medical intervention path  and a palliative comfort care path for this patient at this time was had.  Values and goals of care important to patient and family were attempted to be elicited.  Concept of Hospice and Palliative Care were discussed  Natural trajectory and expectations at EOL were discussed.  Questions and concerns addressed.  Hard Choices booklet left for review. Family encouraged to call with questions or concerns.  PMT will continue to support holistically.   Goals of Care: 1.  Code Status: DNR/DNI   2. Scope of Treatment: 1. Vital Signs: per unit  2. Respiratory/Oxygen: as needed for treatment  3. Nutritional Support/Tube  Feeds:no artificial feeding now or in the future 4. Antibiotics: yes until dc 5. Blood Products: make decision if need arises 6. IVF:yes until dc 7. Review of Medications to be discontinued:continue as is until dc 8. Labs:yes until dc   4. Disposition:  Re meet with this NP on Tuesday morning at 0900 to make definitive decision regarding GOC.  Patients sister/HPOA need "alittle time" to process "all this" before making    3. Symptom Management:   1. Anxiety/Agitation: Ativan 0.5 mg every 6 hrs prn 2. Pain: Tylenol 650 mg supp prn 3. Adult failure to thrive  4. Psychosocial:  Emotional  support offered to family at bedside.  This is very hard for the patient's sister/she is her main support, patient has no other family.  All understand the overall poor prognosis.           Patient Documents Completed or Given: Document Given Completed  Advanced Directives Pkt    MOST    DNR    Gone from My Sight    Hard Choices  yes    Brief HPI: 79 yo female with h/o dementia, depression, CHF, HTN---overall failure to thrive in SNF.  Non ambulatory, incontinent, poor po intake.  Admitted with respiratory failure, UTI Family faced with decsions for future care needs    ROS: patient is lethargic and unable to give information at this time    PMH:  Past Medical History  Diagnosis Date  . CHF (congestive heart failure)   . 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 Esophageal reflux   . Peripheral vascular disease   .  Altered mental status   . Osteoarthrosis   . Hypertension   . Depressive disorder      RUE:AVWUJWJ reviewed. No pertinent past surgical history. I have reviewed the FH and SH and  If appropriate update it with new information. No Known Allergies Scheduled Meds: . antiseptic oral rinse  15 mL Mouth Rinse BID  . aspirin  81 mg Oral Daily  . calcium-vitamin D  1 tablet Oral Daily  . cefTRIAXone (ROCEPHIN)  IV  1 g Intravenous Q24H  . dextrose  1 ampule  Intravenous Once  . docusate sodium  100 mg Oral BID  . enoxaparin (LOVENOX) injection  30 mg Subcutaneous Q24H  . escitalopram  10 mg Oral Daily  . furosemide  20 mg Oral Daily  . guaiFENesin  600 mg Oral BID  . insulin aspart  10 Units Intravenous Once  . mirtazapine  7.5 mg Oral QHS  . multivitamin with minerals  1 tablet Oral Daily  . polyvinyl alcohol  1 drop Both Eyes BID  . sodium chloride  3 mL Intravenous Q12H   Continuous Infusions: . sodium chloride 10 mL/hr at 02/09/13 1229   PRN Meds:.acetaminophen, acetaminophen, albuterol, ipratropium, ondansetron (ZOFRAN) IV, ondansetron, RESOURCE THICKENUP CLEAR    BP 136/57  Pulse 69  Temp(Src) 97.8 F (36.6 C) (Oral)  Resp 18  Ht 5' (1.524 m)  Wt 36.2 kg (79 lb 12.9 oz)  BMI 15.59 kg/m2  SpO2 91%   PPS: 30%   Intake/Output Summary (Last 24 hours) at 02/09/13 1328 Last data filed at 02/08/13 1712  Gross per 24 hour  Intake     60 ml  Output      0 ml  Net     60 ml   LBM: PTA                       Stool Softner:dulcolax supp prn  Physical Exam:  General:  Cachectic, ill appearing NAD HEENT:  moist buccal membranes, no exudate Chest:   Diminished in bases cTA CVS: RRR Abdomen: soft NT +BS Ext: without edema, + muscle wasting Neuro:lethargic, mumbles speech  Labs: CBC    Component Value Date/Time   WBC 9.4 02/09/2013 0443   RBC 4.40 02/09/2013 0443   HGB 9.7* 02/09/2013 0443   HCT 35.5* 02/09/2013 0443   PLT 255 02/09/2013 0443   MCV 80.7 02/09/2013 0443   MCH 22.0* 02/09/2013 0443   MCHC 27.3* 02/09/2013 0443   RDW 20.8* 02/09/2013 0443   LYMPHSABS 0.6* 02/03/2013 0005   MONOABS 1.3* 02/03/2013 0005   EOSABS 0.0 02/03/2013 0005   BASOSABS 0.0 02/03/2013 0005    BMET    Component Value Date/Time   NA 142 02/09/2013 0443   K 3.7 02/09/2013 0443   CL 101 02/09/2013 0443   CO2 32 02/09/2013 0443   GLUCOSE 75 02/09/2013 0443   BUN 9 02/09/2013 0443   CREATININE 0.47* 02/09/2013 0443   CALCIUM 8.9 02/09/2013 0443    GFRNONAA 88* 02/09/2013 0443   GFRAA >90 02/09/2013 0443    CMP     Component Value Date/Time   NA 142 02/09/2013 0443   K 3.7 02/09/2013 0443   CL 101 02/09/2013 0443   CO2 32 02/09/2013 0443   GLUCOSE 75 02/09/2013 0443   BUN 9 02/09/2013 0443   CREATININE 0.47* 02/09/2013 0443   CALCIUM 8.9 02/09/2013 0443   PROT 5.2* 02/08/2013 0428   ALBUMIN 2.5* 02/08/2013 0428   AST  30 02/08/2013 0428   ALT 89* 02/08/2013 0428   ALKPHOS 82 02/08/2013 0428   BILITOT 0.3 02/08/2013 0428   GFRNONAA 88* 02/09/2013 0443   GFRAA >90 02/09/2013 0443      Time In Time Out Total Time Spent with Patient Total Overall Time  1200 1400 100 min 120 min    Greater than 50%  of this time was spent counseling and coordinating care related to the above assessment and plan.   Lorinda Creed NP  Palliative Medicine Team Team Phone # [phone] Pager [phone]  Above discussed with Dr Janee Morn

## 2013-02-10 DIAGNOSIS — R1314 Dysphagia, pharyngoesophageal phase: Secondary | ICD-10-CM

## 2013-02-10 LAB — COMPREHENSIVE METABOLIC PANEL
ALT: 54 U/L — ABNORMAL HIGH (ref 0–35)
AST: 21 U/L (ref 0–37)
Albumin: 2.4 g/dL — ABNORMAL LOW (ref 3.5–5.2)
Alkaline Phosphatase: 79 U/L (ref 39–117)
GFR calc Af Amer: >90 mL/min (ref >90)
Glucose, Bld: 86 mg/dL (ref 70–99)
Potassium: 3.7 mEq/L (ref 3.5–5.1)
Sodium: 144 mEq/L (ref 135–145)
Total Protein: 5 g/dL — ABNORMAL LOW (ref 6.0–8.3)

## 2013-02-10 LAB — CULTURE, BLOOD (ROUTINE X 2)
Culture: NO GROWTH
Culture: NO GROWTH

## 2013-02-10 LAB — GLUCOSE, CAPILLARY: Glucose-Capillary: 89 mg/dL (ref 70–99)

## 2013-02-10 LAB — CBC
Hemoglobin: 9 g/dL — ABNORMAL LOW (ref 12.0–15.0)
MCHC: 27.2 g/dL — ABNORMAL LOW (ref 30.0–36.0)
RBC: 4.01 MIL/uL (ref 3.87–5.11)

## 2013-02-10 MED ORDER — ATROPINE SULFATE 1 % OP SOLN: 2.0000 [drp] | OPHTHALMIC | Status: DC | PRN

## 2013-02-10 MED ORDER — DEXTROSE 50 % IV SOLN: 50.0000 mL | Freq: Once | INTRAVENOUS | Status: DC | PRN

## 2013-02-10 MED ORDER — MORPHINE SULFATE (CONCENTRATE) 10 MG /0.5 ML PO SOLN
2.6000 mg | ORAL | Status: DC | PRN
  Administered 2013-02-10 – 2013-02-11 (×2): 2.6 mg via SUBLINGUAL
  Filled 2013-02-10 (×2): qty 0.5

## 2013-02-10 MED ORDER — SENNOSIDES-DOCUSATE SODIUM 8.6-50 MG PO TABS
1.0000 | ORAL_TABLET | Freq: Two times a day (BID) | ORAL | Status: DC
  Administered 2013-02-10 – 2013-02-11 (×2): 1 via ORAL
  Filled 2013-02-10 (×4): qty 1

## 2013-02-10 NOTE — Progress Notes (Signed)
Nutrition Brief Follow Up Note  - Noted discussion from palliative care meeting indicated pt does not desire artifical feeding and pt transferred to palliative care unit today with goal of comfort. Nutrition signing off. Please consult if needed.   Levon Hedger MS, RD, LDN [phone] Pager [phone] After Hours Pager

## 2013-02-10 NOTE — Progress Notes (Signed)
Patient JY:NWGNFA [place]      DOB: [birth year]      OZH:[id number]   Palliative Medicine Team at [facility] Progress Note    Subjective:Patient alert, verbally responsive, answers appropriate at present, but per staff has intermittent confusion. Oral intake minimal small container of applesauce today, minimal sips of fluid. SLP evaluation 3/14, patient prone for progressive ongoing aspiration.  Re-met with patient sister Hinda Lenis [facility]), for purpose of completing MOST form and decision for level of care. Decisions as Follows:   Code Status: DNR/DNI  Wants focus to be on comfort and symptom management: opting for discharge back to SNF with full hospice support. No future re-admissions unless symptoms cannot be managed at SNF with hospice.   Continue and complete current course of IV antibiotic therapy prior to discharge  Discontinue telemetry, peripheral and CBG monitoring-discontinue Lovenox and insulin injections, stop oral vitamin preparations  Agreeable to transfer to PCU at this time (tour of unit with sister) with transition back to SNF with Full Hospice support  Dr Janee Morn notified with the above information in agreement with plan, and also contacted Ermalinda Memos [phone].   Filed Vitals:   02/10/13 1331  BP: 128/40  Pulse: 55  Temp: 98.3 F (36.8 C)  Resp: 18   Physical exam: General: arouses easily to verbal stimuli, simple verbal responses appropriate HEENT: tongue furrowed, buccal mucosa dry, no lesions CHEST:decreased at bases CTA bilaterally ABD: soft, non-tender BS hypoactive (Last BM on 02/06/13) EXT: no pedal edema   Assessment and plan: 79 yo WF with PMH: dementia, depression, CHF, HTN. Per sister patient has not be eating or drinking very much over past 4 weeks. Albumin 02/10/13 (2.4). Sister indicates patient has chronic bilateral leg discomfort. Family is in agreement that focus should be on comfort at this point-  Symptom management:                                                                                                                                                                   Pain/Dyspnea: patient opioid naive, will start Roxanol at 2.6 mg SL every 3 hours as needed, may need increase in dose  Dysphagia: Dysphagia 2 diet, crush all meds in puree  Anxiety: Ativan 0.5 mg SL as needed  Education: to sister on hospice support and on goals of care decision making  Staff aware of transfer and changes in orders   Time In Time Out Total Time Spent with Patient Total Overall Time  1:30p 3:00p 40 min 90 min   Freddie Breech, CNS-C Palliative Medicine Team [facility] Team Phone: [phone] Pager: [phone]

## 2013-02-10 NOTE — Progress Notes (Signed)
Physical Therapy Treatment Patient Details Name: Carrie [name] MRN: [id number] DOB: September 17, 1925 Today's Date: 02/10/2013 Time: 4098-1191 PT Time Calculation (min): 15 min  PT Assessment / Plan / Recommendation Comments on Treatment Session  Pt only wished to roll today and stated unable to sit EOB today due to not feeling well/fatigue.  Noted GOC meeting tomorrow.    Follow Up Recommendations  SNF;Supervision/Assistance - 24 hour     Does the patient have the potential to tolerate intense rehabilitation     Barriers to Discharge        Equipment Recommendations  None recommended by PT    Recommendations for Other Services    Frequency     Plan Discharge plan remains appropriate;Frequency remains appropriate    Precautions / Restrictions Precautions Precautions: Fall Precaution Comments: posterior lean with sitting Restrictions Weight Bearing Restrictions: No   Pertinent Vitals/Pain Pt reported abdominal pain.    Mobility  Bed Mobility Bed Mobility: Rolling Left;Rolling Right;Scooting to HOB Rolling Right: 1: +2 Total assist Rolling Right: Patient Percentage: 60% Rolling Left: 1: +2 Total assist Rolling Left: Patient Percentage: 60% Scooting to HOB: 1: +2 Total assist Scooting to [facility]: Patient Percentage: 0% Details for Bed Mobility Assistance: Pt performed rolling x2 with hygiene and changing bed pads.  Pt able to assist more with rolling today than previous visits. Transfers Transfers: Not assessed    Exercises     PT Diagnosis:    PT Problem List:   PT Treatment Interventions:     PT Goals Acute Rehab PT Goals PT Goal Formulation:  (goals updated due to expire today) Time For Goal Achievement: 02/17/13 Potential to Achieve Goals: Fair Pt will Roll Supine to Right Side: with min assist PT Goal: Rolling Supine to Right Side - Progress: Goal set today Pt will Roll Supine to Left Side: with min assist PT Goal: Rolling Supine to Left Side - Progress: Goal set  today Pt will go Supine/Side to Sit: with max assist PT Goal: Supine/Side to Sit - Progress: Goal set today Pt will Sit at Edge of Bed: with mod assist;3-5 min;with bilateral upper extremity support PT Goal: Sit at Edge Of Bed - Progress: Goal set today Pt will go Sit to Supine/Side: with max assist PT Goal: Sit to Supine/Side - Progress: Goal set today  Visit Information  Last PT Received On: 02/10/13 Assistance Needed: +2    Subjective Data  Subjective: I can't.  (sit EOB)   Cognition  Cognition Overall Cognitive Status: History of cognitive impairments - at baseline    Balance     End of Session PT - End of Session Equipment Utilized During Treatment: Oxygen Activity Tolerance: Patient limited by fatigue Patient left: in bed;with call bell/phone within reach;with bed alarm set   GP     Foye Haggart,KATHrine E 02/10/2013, 1:23 PM Zenovia Jarred, PT, DPT 02/10/2013 Pager: [phone]

## 2013-02-10 NOTE — Progress Notes (Signed)
Hypoglycemic Event  CBG: 62  Treatment: 15 GM carbohydrate snack  Symptoms: None  Follow-up CBG: Time:0810 CBG Result:82  Possible Reasons for Event: Inadequate meal intake  Comments/MD notified:    Deneise Lever  Remember to initiate Hypoglycemia Order Set & complete

## 2013-02-10 NOTE — Plan of Care (Signed)
Problem: Phase I Progression Outcomes Goal: OOB as tolerated unless otherwise ordered Outcome: Completed/Met Date Met:  02/10/13 Patient OOB into chair on Friday, March 14th.

## 2013-02-10 NOTE — Progress Notes (Signed)
Pt arrived to Palliative unit soaked in urine and IV tubing outdated. IV tubing changed and pt cleaned up and changed into dry pajamas and sheets. She is alert to self, confused to place and situation. O2 3L via [place]. Denies any pain. Repositioned to Rt. Side. Sleeping comfortably.

## 2013-02-10 NOTE — Progress Notes (Signed)
[facility] PROGRESS NOTE  Carrie [name] ZOX:[id number] DOB: 03/28/1925 DOA: 02/02/2013 PCP: No primary provider on file.  Brief History 79 year old female with a history of dementia, depression, CHF, hypertension and stroke presents with bradycardia and low oxygen saturation from her nursing home. The patient has dementia and is unable to provide any history whatsoever. She is on an oxygen saturation of 88% on RA. Workup has revealed proBNP 22,537 with chest x-ray suggestive of pulmonary vascular engorgement and increased interstitial changes when compared to previous chest x-rays in 2013. Initial WBC count was 11.9 with temperature of 99.42F. The patient was initially started on Zosyn because of concerns for pneumonia. However, the patient's procalcitonin has been 0.10 and <0.10. The patient was also noted to be hyperkalemic with potassium 6.7, serum creatinine 1.52 at the time of admission. The patient had been given 2 doses of Kayexalate with correction of her potassium. The patient was given furosemide 40 mg IV x1 dose and put out approximately 1200 cc of urine upon arrival to the floor. Repeat chest x-ray shows improvement of her pulmonary vascular engorgement, but continued bilateral lower lobe atelectasis/opacities. The patient has not been tachycardic nor febrile. Her Zosyn was discontinued after 24 hours on 02/04/2013. The patient has borderline low blood pressures since given furosemide ranging normally in the mid to upper 90s. However she has occasional blood pressures in the mid 80s, and received approximately 500 cc of fluid in total as a result of her mild hypotension. Assessment/Plan: Acute respiratory failure with hypoxemia -Due to acute on chronic diastolic CHF -Oxygen saturation has gradually improved -No respiratory distress presently over the past 48 hours -Oxygen saturation 95-90% on 2 L Acute on chronic diastolic CHF--predominantly right-sided heart failure -Echocardiogram EF  65-70% with septal flattening suggesting RV overload -Continue judicious furosemide. Hold furosemide dose today secondary to alkalosis. Continue lasix 20mg  daily. -Serum creatinine has improved with diuresis -Strict I.'s and O.'s -Daily weights Ascites/Transaminasemia -Likely related to the patient's right heart failure resulting in chronic hepatic congestion -Will not pursue any aggressive measures including paracentesis to obtain SAAG -Dr Tat spoke with the patient's sister (POA) who does not want any invasive procedures at this time -trend liver enzymes -Continue judicious diuresis--lasix 20mg  po qday -Abdominal ultrasound shows thickened gallbladder--likely due to the patient's ascites--no clinical sign of cholecystitis on exam Head drop -Patient has not had muscle tone to her upper head, suspect underlying myopathy although may have been related partly to electrolyte abnormalities -CPK 271 -soft cervical collar during meals to lessen risk of aspiration -Speech has evaluated the patient and recommended dysphagia 2 diet -Neurology has been consult and feel patient's symptoms are likely metabolic in nature and no further neurological workup is needed at this time. -D/C gabapentin. -CT brain neg Hypokalemia/hyperkalemia Secondary to diuretics. Hyperkalemic likely secondary to aggressive replacement, has since resolved. Follow.  Dysphagia -As discussed above -Modified barium swallow showed silent aspiration of thin liquids.  -Continue current diet. Klebsiella pneumonia Urinary tract infection Urine cultures with greater than 100,000 gram-negative rods. Pansensitive. Continue IV Rocephin D6/7. Abdominal pain/CONSTIPATION Patient per nursing with good BM after enema. Will give another enema today. Follow Prognosis Patient somewhat lethargic however opens eyes to verbal stimuli and ff some commands. Palliative care has met with family and goal is to complete antibiotics ,then back to SNF  with hospice and comfort care. CODE STATUS -patient's sister has decided on patient been a DO NOT RESUSCITATE.    Family Communication:   Updated patient and sister AT  [place].  Disposition Plan:   Back to SNF     Antibiotics:  Zosyn 02/03/2013>>> 02/04/2013  IV Rocephin 02/05/2013    Procedures/Studies: Ct Head Wo Contrast  02/03/2013  *RADIOLOGY REPORT*  Clinical Data: Left arm weakness.  Encephalopathy.  CT HEAD WITHOUT CONTRAST  Technique:  Contiguous axial images were obtained from the base of the skull through the vertex without contrast.  Comparison: 01/10/2010  Findings: There is no acute intracranial hemorrhage, infarction, or mass lesion.  There is diffuse cerebral cortical and cerebellar atrophy.  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  Chronic mucosal thickening in the paranasal sinuses, stable.  IMPRESSION: No acute abnormality.  Diffuse atrophy.   Original Report Authenticated By: Francene Boyers, M.D.    US Abdomen Complete  02/03/2013  *RADIOLOGY REPORT*  Clinical Data:  Acute renal failure.  Elevated LFTs.  History of CHF, hypertension.  COMPLETE ABDOMINAL ULTRASOUND  Comparison:  CT of the abdomen and pelvis on 09/05/2009  Findings:  Gallbladder:  Gallbladder wall is thickened, 3.7 mm.  No gallstones or pericholecystic fluid identified.  No sonographic Murphy's sign.  Common bile duct:  4.6 mm, within normal limits.  Liver:  There is limited visualization of the lower.  Visualized aspect shows no focal lesion.  IVC:  Appears normal.  Pancreas:  There is limited visualization of the pancreas because of bowel gas.  Spleen:  The visualized portion of the spleen is normal in appearance, 5.3 cm in length.  Right Kidney:  There is parenchymal thinning of the right kidney. Length is 8.7 cm.  No focal mass or hydronephrosis.  Left Kidney:  9.7 cm in length.  No focal mass or hydronephrosis.  Abdominal aorta:  Abdominal aorta is 1.8 cm in diameter.   Additional findings:  Ascites and right pleural effusion are noted.  IMPRESSION:  1.  Thickened gallbladder wall which can be a nonspecific finding, often associated with ascites. 2.  No other evidence for acute cholecystitis. 3.  Limited evaluation of the liver and pancreas. 4.  Parenchymal thinning of the right kidney is felt to be a chronic abnormality, but likely new since exam in 2010. Consider CT of the abdomen pelvis without contrast to evaluate for possible obstruction.   Original Report Authenticated By: Norva Pavlov, M.D.    Dg Chest Port 1 View  02/03/2013  *RADIOLOGY REPORT*  Clinical Data: Pulmonary infiltrates.  PORTABLE CHEST - 1 VIEW  Comparison: 02/02/2013 and 01/15/2010  Findings: The heart size and pulmonary vascularity are normal. There is minimal atelectasis in the right mid and lower lung zone and at the left base medially.  No consolidative infiltrates or effusions.  No acute osseous abnormality.  IMPRESSION: Slight atelectasis.   Original Report Authenticated By: Francene Boyers, M.D.    Dg Chest Portable 1 View  02/02/2013  *RADIOLOGY REPORT*  Clinical Data: Bradycardia, history of CHF  PORTABLE CHEST - 1 VIEW  Comparison: 01/15/2010  Findings: Cardiomegaly with pulmonary vascular congestion and suspected mild interstitial edema.  Mild patchy left basilar opacity, atelectasis versus pneumonia.  Underlying chronic interstitial markings.  No pneumothorax.  Left lung base is obscured by defibrillator pads.  IMPRESSION: Cardiomegaly with pulmonary vascular congestion and suspected mild interstitial edema.  Mild patchy left basilar opacity, atelectasis versus pneumonia.   Original Report Authenticated By: Charline Bills, M.D.          Subjective: Patient is sleeping however opens eyes to verbal stimuli. She denies any headache, chest pain, shortness breath, abdominal pain.  Objective:  Filed Vitals:   02/10/13 0800 02/10/13 1219 02/10/13 1220 02/10/13 1331  BP:    128/40   Pulse:    55  Temp:    98.3 F (36.8 C)  TempSrc:    Oral  Resp:    18  Height:      Weight:      SpO2: 95% 89% 92% 98%    Intake/Output Summary (Last 24 hours) at 02/10/13 1702 Last data filed at 02/10/13 1500  Gross per 24 hour  Intake    550 ml  Output      0 ml  Net    550 ml   Weight change: 0.9 kg (1 lb 15.8 oz) Exam:   General:  Pt is sleeping, opens eyes to verbal stimuli, intermittenly follows commands appropriately, not in acute distress  HEENT: No icterus, No thrush,  [place]/AT  Cardiovascular: RRR, S1/S2, no rubs, no gallops  Respiratory: Bibasilar crackles. No wheezes or rhonchi. Good air movement.  Abdomen: Soft/+BS, non tender, non distended, no guarding  Extremities:  No lymphangitis, No petechiae, No rashes, no synovitis  Data Reviewed: Basic Metabolic Panel:  Recent Labs Lab 02/04/13 0340 02/05/13 0442  02/06/13 0408 02/06/13 0745 02/07/13 0450 02/08/13 0428 02/09/13 0443 02/10/13 0420  NA 139 142  < > 142  --  142 141 142 144  K 3.3* 2.2*  < > 5.7* 5.2* 3.7 3.4* 3.7 3.7  CL 98 96  < > 103  --  99 98 101 105  CO2 33* 39*  < > 36*  --  39* 40* 32 34*  GLUCOSE 106* 100*  < > 91  --  87 78 75 86  BUN 44* 21  < > 11  --  10 9 9 10   CREATININE 1.01 0.65  < > 0.45*  --  0.46* 0.45* 0.47* 0.45*  CALCIUM 7.9* 8.0*  < > 8.0*  --  8.5 8.9 8.9 8.9  MG 2.0 1.8  --   --   --   --   --   --   --   < > = values in this interval not displayed. Liver Function Tests:  Recent Labs Lab 02/04/13 0340 02/05/13 0442 02/06/13 0408 02/08/13 0428 02/10/13 0420  AST 199* 156* 68* 30 21  ALT 205* 235* 158* 89* 54*  ALKPHOS 93 88 77 82 79  BILITOT 0.3 0.3 0.2* 0.3 0.2*  PROT 5.5* 5.6* 5.2* 5.2* 5.0*  ALBUMIN 2.7* 2.7* 2.4* 2.5* 2.4*   No results found for this basename: LIPASE, AMYLASE,  in the last 168 hours No results found for this basename: AMMONIA,  in the last 168 hours CBC:  Recent Labs Lab 02/05/13 0442 02/06/13 0408 02/08/13 0428  02/09/13 0443 02/10/13 0420  WBC 8.0 7.1 8.7 9.4 7.5  HGB 9.6* 9.1* 9.3* 9.7* 9.0*  HCT 34.7* 33.0* 33.7* 35.5* 33.1*  MCV 80.7 81.3 80.6 80.7 82.5  PLT 235 217 209 255 217   Cardiac Enzymes: No results found for this basename: CKTOTAL, CKMB, CKMBINDEX, TROPONINI,  in the last 168 hours BNP: No components found with this basename: POCBNP,  CBG:  Recent Labs Lab 02/08/13 0740 02/09/13 0758 02/10/13 0752 02/10/13 0811 02/10/13 1207  GLUCAP 100* 78 62* 86 89    Recent Results (from the past 240 hour(s))  CULTURE, BLOOD (ROUTINE X 2)     Status: None   Collection Time    02/02/13  5:50 PM      Result Value Range Status  Specimen Description BLOOD RIGHT HAND   Final   Special Requests BOTTLES DRAWN AEROBIC AND ANAEROBIC 2.5CC EACH   Final   Culture  Setup Time 02/02/2013 23:28   Final   Culture NO GROWTH 5 DAYS   Final   Report Status 02/10/2013 FINAL   Final  URINE CULTURE     Status: None   Collection Time    02/02/13  6:35 PM      Result Value Range Status   Specimen Description URINE, CATHETERIZED   Final   Special Requests NONE   Final   Culture  Setup Time 02/03/2013 01:27   Final   Colony Count >=100,000 COLONIES/ML   Final   Culture KLEBSIELLA PNEUMONIAE   Final   Report Status 02/06/2013 FINAL   Final   Organism ID, Bacteria KLEBSIELLA PNEUMONIAE   Final  CULTURE, BLOOD (ROUTINE X 2)     Status: None   Collection Time    02/02/13  6:42 PM      Result Value Range Status   Specimen Description BLOOD RIGHT ARM   Final   Special Requests BOTTLES DRAWN AEROBIC AND ANAEROBIC 4CC EACH   Final   Culture  Setup Time 02/02/2013 23:28   Final   Culture NO GROWTH 5 DAYS   Final   Report Status 02/10/2013 FINAL   Final  MRSA PCR SCREENING     Status: None   Collection Time    02/02/13  8:54 PM      Result Value Range Status   MRSA by PCR NEGATIVE  NEGATIVE Final   Comment:            The GeneXpert MRSA Assay (FDA     approved for NASAL specimens     only), is  one component of a     comprehensive MRSA colonization     surveillance program. It is not     intended to diagnose MRSA     infection nor to guide or     monitor treatment for     MRSA infections.     Scheduled Meds: . antiseptic oral rinse  15 mL Mouth Rinse BID  . aspirin  81 mg Oral Daily  . cefTRIAXone (ROCEPHIN)  IV  1 g Intravenous Q24H  . escitalopram  10 mg Oral Daily  . furosemide  20 mg Oral Daily  . guaiFENesin  600 mg Oral BID  . mirtazapine  7.5 mg Oral QHS  . polyvinyl alcohol  1 drop Both Eyes BID  . senna-docusate  1 tablet Oral BID  . sodium chloride  3 mL Intravenous Q12H   Continuous Infusions: . sodium chloride 10 mL/hr at 02/09/13 1229     Carrie Radman, MD  [facility] Pager [phone]  If 7PM-7AM, please contact night-coverage [email or web address] Password [email or web address] 02/10/2013, 5:02 PM   LOS: 8 days

## 2013-02-11 LAB — BASIC METABOLIC PANEL
Chloride: 102 mEq/L (ref 96–112)
GFR calc Af Amer: >90 mL/min (ref >90)
Potassium: 3.7 mEq/L (ref 3.5–5.1)
Sodium: 143 mEq/L (ref 135–145)

## 2013-02-11 MED ORDER — ACETAMINOPHEN 325 MG PO TABS: 650.0000 mg | ORAL_TABLET | Freq: Four times a day (QID) | ORAL | Status: DC | PRN

## 2013-02-11 MED ORDER — LORAZEPAM 0.5 MG PO TABS: 0.5000 mg | ORAL_TABLET | Freq: Four times a day (QID) | ORAL | Status: DC | PRN

## 2013-02-11 MED ORDER — RESOURCE THICKENUP CLEAR PO POWD: ORAL | Status: AC

## 2013-02-11 MED ORDER — MORPHINE SULFATE (CONCENTRATE) 10 MG /0.5 ML PO SOLN: 2.6000 mg | ORAL | Status: DC | PRN

## 2013-02-11 MED ORDER — FUROSEMIDE 20 MG PO TABS: 20.0000 mg | ORAL_TABLET | Freq: Every day | ORAL | Status: DC

## 2013-02-11 MED ORDER — BISACODYL 10 MG RE SUPP: 10.0000 mg | Freq: Every day | RECTAL | Status: AC | PRN

## 2013-02-11 MED ORDER — ATROPINE SULFATE 1 % OP SOLN: OPHTHALMIC | Status: AC

## 2013-02-11 NOTE — Discharge Summary (Signed)
Physician Discharge Summary  Tanessa Tidd VHQ:[id number] DOB: 10/28/1925 DOA: 02/02/2013  PCP: No primary provider on file.  Admit date: 02/02/2013 Discharge date: 02/11/2013  Time spent: 65 minutes  Recommendations for Outpatient Follow-up:  1. Followup with M.D. at skilled nursing facility. Patient was discharged to a skilled nursing facility with a full hospice support.  Discharge Diagnoses:  Principal Problem:   Acute respiratory failure with hypoxia Active Problems:   UTI (lower urinary tract infection)   Leukocytosis   Hyperkalemia   Anemia   Coagulopathy   Acute kidney failure   Dementia   Acute diastolic CHF (congestive heart failure)   Transaminasemia   Acute on chronic diastolic CHF (congestive heart failure)   Ascites   Generalized weakness   UTI (urinary tract infection)   Hypokalemia   Unspecified constipation   Pain, generalized   Anxiety state, unspecified   Dysphagia, pharyngoesophageal phase   Discharge Condition: Stable  Diet recommendation: Regular/dysphagia 2 diet with nectar thick liquids.  Filed Weights   02/08/13 0520 02/09/13 0512 02/10/13 0645  Weight: 38.2 kg (84 lb 3.5 oz) 36.2 kg (79 lb 12.9 oz) 37.1 kg (81 lb 12.7 oz)    History of present illness:  79 year old Carrie [name] with past medical history of dementia, depression who was transferred from [facility] 02/02/2013 due to low heart rate (48) and oxygen saturation of 88%. There was no reports of shortness of breat or fevers in nursing home. Patient is not a good historian due to dementia. Per ED physician, nursing home staff reported patient had complaints of abdominal pain but there was no report of vomiting. No falls or loss of consciousness.  In ED, evaluation included CXR which was concerning for pulmonary vascular congestion. Patient had significant electrolyte abnormalities on admission which included hyperkalemia of 6.7, creatinine of 1.52 and INR of 1.69. CBC revealed WBC count of 11.9 and hemoglobin  of 8.8.  Patient was given the following in ED to reduce the potassium level: albuterol nebulizer, insulin 10 units (along with D50), kayexalate and calcium gluconate. Repeat potassium level is pending.      Hospital Course:  Acute respiratory failure with hypoxemia  Patient had presented from skilled nursing facility due to low heart rates and hypoxia and noted to be in acute respiratory failure with hypoxemia. No fevers have been noted. Chest x-ray which was done was negative for any acute infiltrate. It was noted on chest x-ray to have some pulmonary vascular congestion. It was felt patient's acute respiratory failure was secondary to acute on chronic diastolic heart failure right-sided. Patient was placed on IV Lasix and diuresed judiciously. Patient's O2 sats improved. Patient was subsequently transitioned to oral Lasix initially at 40 mg daily. This was subsequently decreased to 20 mg daily. Patient's respiratory distress improved and patient was in stable condition. Patient be discharged to skilled nursing facility with hospice. Acute on chronic diastolic CHF--predominantly right-sided heart failure  Patient was admitted with acute respiratory distress secondary to acute on chronic diastolic heart failure predominantly right-sided failure. Patient was initially placed on IV Lasix and diuresed. Cardiac enzymes were cycled which were negative x3. 2-D echo which was done did show an EF of 65-70% with septal flattening suggesting right ventricular overload. Patient was also noted to have a transaminitis which was felt to be secondary to hepatic congestion. Patient was diuresis with IV Lasix and subsequently transitioned to oral Lasix which she tolerated. Patient improved clinically hepatic congestion improved and she was followed. Patient will be discharged  home on Lasix 20 mg daily with hospice. Ascites/Transaminasemia  On admission, patient was noted to have hepatic congestion with a sinus entrance  and an anemia which was felt to be secondary to acute on chronic diastolic heart failure. Abdominal ultrasound which was done showed a thickened gallbladder but negative for cholecystitis. Patient's sister who is a healthcare power of attorney did not want any aggressive procedures at this time and alternatives for medical treatment. Patient was diuresis and LFTs were followed which trended down. Patient was subsequently transitioned to oral Lasix she was discharged on 20 mg daily.    Head drop  The hospitalization patient was noted to have a head drop with decreased muscle tone to her upper head. There was concern for an underlying myopathy versus secondary to patient's electrolyte abnormalities and urinary tract infection. CPK which was obtained was 271. Patient was placed in a soft cervical collar during meals to decrease the risk of aspiration. Patient was also evaluated by speech therapy and placed on a dysphagia 2 diet. CT of the head which was done was negative. Neurology was consulted and it was felt that patient's symptoms were metabolic in nature and no further neurological workup was needed at that time. Patient's gabapentin was discontinued.  Hypokalemia/hyperkalemia  On admission patient was noted to be hyperkalemic. Which was felt to be secondary to her acute metabolic derangement. Patient was given some Kayexalate and diuresis with IV Lasix with resolution of her hyperkalemia. Patient subsequently became hypokalemic and her potassium was repleted. Patient's hyperkalemia and hypokalemia had resolved by day of discharge.  Dysphagia  During the position due to concern for aspiration and head drop is speech therapy assess the patient and patient subsequently underwent a modified barium swallow exam which did show silent aspiration. Patient was subsequently placed on a dysphagia 2 diet with nectar thick liquids and aspiration precautions. A soft cervical collar was also placed to help decrease  incidence of aspiration. Patient will be discharged on a dysphagia 2 diet with nectar thick liquids andc collar as needed during feeding.  Klebsiella pneumonia Urinary tract infection  Urinalysis which was done on admission was consistent with a UTI. Patient was placed on IV Rocephin. Urine cultures with greater than 100,000 gram-negative rods. Pansensitive. Patient received one week course of IV Rocephin during this hospitalization and doesn't require any further antibiotic therapy. Abdominal pain/CONSTIPATION  Patient denies sizes and have some complaints of abdominal pain. Abdominal x-rays which were done with consistent with constipation. Patient was given enemas with resolution of her constipation. Patient will be discharged on a bowel regimen. Failure to thrive During the hospitalization patient has been noted to have failure to thrive. Due to patient's hospital course a palliative care consultation was obtained for goals of care. Patient was seen in consultation by Jannette Fogo ,NP whom met with the family and and focus was on call for that symptom management opted for discharge back to the skilled nursing facility with full hospice support with no future readmissions unless symptoms cannot be managed at SNF with hospice. Patient was made a DO NOT RESUSCITATE/DO NOT INTUBATE. Patient completed her IV course of antibiotic therapy. Patient will be discharged to the skilled nursing facility with a full hospice support.    Procedures: Ct Head Wo Contrast 2-D echo 02/03/2013 Abdominal ultrasound 02/03/2013      Consultations:  Palliative Care : Jannette Fogo, NP  Neurology: Dr Roseanne Reno 02/03/13  Discharge Exam: Filed Vitals:   02/10/13 1219 02/10/13 1220 02/10/13 1331 02/11/13  0627  BP:   128/40 101/89  Pulse:   55 63  Temp:   98.3 F (36.8 C) 97.7 F (36.5 C)  TempSrc:   Oral Oral  Resp:   18 18  Height:      Weight:      SpO2: 89% 92% 98% 92%    General: NAD. Alert ff  commands. Cardiovascular: RRR Respiratory: Bibasilar crackles  Discharge Instructions  Discharge Orders   Future Orders Complete By Expires     Diet general  As directed     Comments:      Dysphagia 2 diet with nectar thick liquids    Discharge instructions  As directed     Comments:      Use soft C Collar as needed for head drop and feeding if needed.    Increase activity slowly  As directed         Medication List    STOP taking these medications       calcium-vitamin D 500-200 MG-UNIT per tablet  Commonly known as:  OSCAL WITH D     guaiFENesin 600 MG 12 hr tablet  Commonly known as:  MUCINEX     multivitamin with minerals tablet     VITAMIN B 12 PO      TAKE these medications       acetaminophen 325 MG tablet  Commonly known as:  TYLENOL  Take 2 tablets (650 mg total) by mouth every 6 (six) hours as needed for pain or fever.     aspirin 81 MG tablet  Take 81 mg by mouth daily.     atropine 1 % ophthalmic solution  2 DROPS SUBLINGUAL EVERY 4 HOURS AS NEEDED FOR SECRETIONS.     bisacodyl 10 MG suppository  Commonly known as:  DULCOLAX  Place 1 suppository (10 mg total) rectally daily as needed.     docusate sodium 100 MG capsule  Commonly known as:  COLACE  Take 100 mg by mouth 2 (two) times daily.     escitalopram 10 MG tablet  Commonly known as:  LEXAPRO  Take 10 mg by mouth daily.     furosemide 20 MG tablet  Commonly known as:  LASIX  Take 1 tablet (20 mg total) by mouth daily.     gabapentin 600 MG tablet  Commonly known as:  NEURONTIN  Take 600 mg by mouth 3 (three) times daily.     LORazepam 0.5 MG tablet  Commonly known as:  ATIVAN  Take 1 tablet (0.5 mg total) by mouth every 6 (six) hours as needed for anxiety.     mirtazapine 7.5 MG tablet  Commonly known as:  REMERON  Take 7.5 mg by mouth at bedtime.     morphine CONCENTRATE 10 mg / 0.5 ml concentrated solution  Place 0.13 mLs (2.6 mg total) under the tongue every 3 (three) hours  as needed.     potassium chloride 10 MEQ tablet  Commonly known as:  K-DUR  Take 10 mEq by mouth daily.     ranitidine 150 MG tablet  Commonly known as:  ZANTAC  Take 150 mg by mouth at bedtime.     RESOURCE THICKENUP CLEAR Powd  uSE FOR DIET WITH NECTAR THICK LIQUIDS     SYSTANE 0.4-0.3 % Gel  Generic drug:  Polyethyl Glycol-Propyl Glycol  Apply 1 drop to eye 2 (two) times daily.     tamsulosin 0.4 MG Caps  Commonly known as:  FLOMAX  Take 0.4 mg  by mouth daily.           Follow-up Information   Please follow up. (f/u with MD at SNF with hospice)        The results of significant diagnostics from this hospitalization (including imaging, microbiology, ancillary and laboratory) are listed below for reference.    Significant Diagnostic Studies: Dg Abd 1 View  02/08/2013  *RADIOLOGY REPORT*  Clinical Data: Abdominal pain  ABDOMEN - 1 VIEW  Comparison: 09/16/2009  Findings: There is hyperdense fecal matter distending the rectum suggesting fecal impaction.  There is a prominent gas pattern within the small and large bowel proximal to that.  This could indicate ileus.  Calcified leiomyoma again evident in the right pelvis.  IMPRESSION: Hyperdense stool filling and distending the rectum suggesting impaction.  Increased intestinal gas proximal to that.   Original Report Authenticated By: Paulina Fusi, M.D.    Ct Head Wo Contrast  02/03/2013  *RADIOLOGY REPORT*  Clinical Data: Left arm weakness.  Encephalopathy.  CT HEAD WITHOUT CONTRAST  Technique:  Contiguous axial images were obtained from the base of the skull through the vertex without contrast.  Comparison: 01/10/2010  Findings: There is no acute intracranial hemorrhage, infarction, or mass lesion.  There is diffuse cerebral cortical and cerebellar atrophy.  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  Chronic mucosal thickening in the paranasal sinuses, stable.  IMPRESSION: No acute abnormality.  Diffuse  atrophy.   Original Report Authenticated By: Francene Boyers, M.D.    US Abdomen Complete  02/03/2013  *RADIOLOGY REPORT*  Clinical Data:  Acute renal failure.  Elevated LFTs.  History of CHF, hypertension.  COMPLETE ABDOMINAL ULTRASOUND  Comparison:  CT of the abdomen and pelvis on 09/05/2009  Findings:  Gallbladder:  Gallbladder wall is thickened, 3.7 mm.  No gallstones or pericholecystic fluid identified.  No sonographic Murphy's sign.  Common bile duct:  4.6 mm, within normal limits.  Liver:  There is limited visualization of the lower.  Visualized aspect shows no focal lesion.  IVC:  Appears normal.  Pancreas:  There is limited visualization of the pancreas because of bowel gas.  Spleen:  The visualized portion of the spleen is normal in appearance, 5.3 cm in length.  Right Kidney:  There is parenchymal thinning of the right kidney. Length is 8.7 cm.  No focal mass or hydronephrosis.  Left Kidney:  9.7 cm in length.  No focal mass or hydronephrosis.  Abdominal aorta:  Abdominal aorta is 1.8 cm in diameter.  Additional findings:  Ascites and right pleural effusion are noted.  IMPRESSION:  1.  Thickened gallbladder wall which can be a nonspecific finding, often associated with ascites. 2.  No other evidence for acute cholecystitis. 3.  Limited evaluation of the liver and pancreas. 4.  Parenchymal thinning of the right kidney is felt to be a chronic abnormality, but likely new since exam in 2010. Consider CT of the abdomen pelvis without contrast to evaluate for possible obstruction.   Original Report Authenticated By: Norva Pavlov, M.D.    Dg Chest Port 1 View  02/03/2013  *RADIOLOGY REPORT*  Clinical Data: Pulmonary infiltrates.  PORTABLE CHEST - 1 VIEW  Comparison: 02/02/2013 and 01/15/2010  Findings: The heart size and pulmonary vascularity are normal. There is minimal atelectasis in the right mid and lower lung zone and at the left base medially.  No consolidative infiltrates or effusions.  No acute  osseous abnormality.  IMPRESSION: Slight atelectasis.   Original Report Authenticated By: Francene Boyers,  M.D.    Dg Chest Portable 1 View  02/02/2013  *RADIOLOGY REPORT*  Clinical Data: Bradycardia, history of CHF  PORTABLE CHEST - 1 VIEW  Comparison: 01/15/2010  Findings: Cardiomegaly with pulmonary vascular congestion and suspected mild interstitial edema.  Mild patchy left basilar opacity, atelectasis versus pneumonia.  Underlying chronic interstitial markings.  No pneumothorax.  Left lung base is obscured by defibrillator pads.  IMPRESSION: Cardiomegaly with pulmonary vascular congestion and suspected mild interstitial edema.  Mild patchy left basilar opacity, atelectasis versus pneumonia.   Original Report Authenticated By: Charline Bills, M.D.     Microbiology: Recent Results (from the past 240 hour(s))  CULTURE, BLOOD (ROUTINE X 2)     Status: None   Collection Time    02/02/13  5:Carrie PM      Result Value Range Status   Specimen Description BLOOD RIGHT HAND   Final   Special Requests BOTTLES DRAWN AEROBIC AND ANAEROBIC 2.5CC EACH   Final   Culture  Setup Time 02/02/2013 23:28   Final   Culture NO GROWTH 5 DAYS   Final   Report Status 02/10/2013 FINAL   Final  URINE CULTURE     Status: None   Collection Time    02/02/13  6:35 PM      Result Value Range Status   Specimen Description URINE, CATHETERIZED   Final   Special Requests NONE   Final   Culture  Setup Time 02/03/2013 01:27   Final   Colony Count >=100,000 COLONIES/ML   Final   Culture KLEBSIELLA PNEUMONIAE   Final   Report Status 02/06/2013 FINAL   Final   Organism ID, Bacteria KLEBSIELLA PNEUMONIAE   Final  CULTURE, BLOOD (ROUTINE X 2)     Status: None   Collection Time    02/02/13  6:42 PM      Result Value Range Status   Specimen Description BLOOD RIGHT ARM   Final   Special Requests BOTTLES DRAWN AEROBIC AND ANAEROBIC 4CC EACH   Final   Culture  Setup Time 02/02/2013 23:28   Final   Culture NO GROWTH 5 DAYS   Final    Report Status 02/10/2013 FINAL   Final  MRSA PCR SCREENING     Status: None   Collection Time    02/02/13  8:54 PM      Result Value Range Status   MRSA by PCR NEGATIVE  NEGATIVE Final   Comment:            The GeneXpert MRSA Assay (FDA     approved for NASAL specimens     only), is one component of a     comprehensive MRSA colonization     surveillance program. It is not     intended to diagnose MRSA     infection nor to guide or     monitor treatment for     MRSA infections.     Labs: Basic Metabolic Panel:  Recent Labs Lab 02/05/13 0442  02/07/13 0450 02/08/13 0428 02/09/13 0443 02/10/13 0420 02/11/13 0349  NA 142  < > 142 141 142 144 143  K 2.2*  < > 3.7 3.4* 3.7 3.7 3.7  CL 96  < > 99 98 101 105 102  CO2 39*  < > 39* 40* 32 34* 38*  GLUCOSE 100*  < > 87 78 75 86 72  BUN 21  < > 10 9 9 10 9   CREATININE 0.65  < > 0.46* 0.45* 0.47* 0.45* 0.48*  CALCIUM 8.0*  < > 8.5 8.9 8.9 8.9 8.7  MG 1.8  --   --   --   --   --   --   < > = values in this interval not displayed. Liver Function Tests:  Recent Labs Lab 02/05/13 0442 02/06/13 0408 02/08/13 0428 02/10/13 0420  AST 156* 68* 30 21  ALT 235* 158* 89* 54*  ALKPHOS 88 77 82 79  BILITOT 0.3 0.2* 0.3 0.2*  PROT 5.6* 5.2* 5.2* 5.0*  ALBUMIN 2.7* 2.4* 2.5* 2.4*   No results found for this basename: LIPASE, AMYLASE,  in the last 168 hours No results found for this basename: AMMONIA,  in the last 168 hours CBC:  Recent Labs Lab 02/05/13 0442 02/06/13 0408 02/08/13 0428 02/09/13 0443 02/10/13 0420  WBC 8.0 7.1 8.7 9.4 7.5  HGB 9.6* 9.1* 9.3* 9.7* 9.0*  HCT 34.7* 33.0* 33.7* 35.5* 33.1*  MCV 80.7 81.3 80.6 80.7 82.5  PLT 235 217 209 255 217   Cardiac Enzymes: No results found for this basename: CKTOTAL, CKMB, CKMBINDEX, TROPONINI,  in the last 168 hours BNP: BNP (last 3 results)  Recent Labs  02/02/13 1619  PROBNP 22537.0*   CBG:  Recent Labs Lab 02/08/13 0740 02/09/13 0758 02/10/13 0752  02/10/13 0811 02/10/13 1207  GLUCAP 100* 78 62* 86 89       Signed:  Fredricka Kohrs  [facility] 02/11/2013, 10:06 AM

## 2013-02-11 NOTE — Progress Notes (Signed)
CSW following for return to [facility] when medically ready. CSW has completed FL2 & will continue to follow and assist with return. Patient moved down to Palliative Care Unit yesterday, Selena Lesser, Unit CSW aware & will follow.    Unice Bailey, LCSW [facility] Clinical Social Worker cell #: [phone]

## 2013-02-11 NOTE — Progress Notes (Signed)
Patient is set to discharge back to [facility] today. Patient's sister, Eduardo Osier (ph#: [phone]) aware. Discharge packet in [facility] including FL2, DNR, AVS, chart copy & prescriptions. [facility] called for transport.   Unice Bailey, LCSW [facility] Clinical Social Worker cell #: [phone]

## 2013-02-11 NOTE — Progress Notes (Signed)
Called report to [employer].  [facility].  He verbalized understanding.

## 2013-02-12 LAB — GLUCOSE, CAPILLARY: Glucose-Capillary: 78 mg/dL (ref 70–99)

## 2013-02-14 ENCOUNTER — Other Ambulatory Visit: Payer: Self-pay | Admitting: *Deleted

## 2013-02-14 MED ORDER — LORAZEPAM 0.5 MG PO TABS: 0.5000 mg | ORAL_TABLET | Freq: Four times a day (QID) | ORAL | Status: DC | PRN

## 2013-02-14 MED ORDER — MORPHINE SULFATE (CONCENTRATE) 20 MG/ML PO SOLN: ORAL | Status: DC

## 2013-02-19 ENCOUNTER — Encounter: Payer: Self-pay | Admitting: Internal Medicine

## 2013-02-19 ENCOUNTER — Non-Acute Institutional Stay (SKILLED_NURSING_FACILITY): Payer: Medicare Other | Admitting: Internal Medicine

## 2013-02-19 DIAGNOSIS — D649 Anemia, unspecified: Secondary | ICD-10-CM

## 2013-02-19 DIAGNOSIS — Z515 Encounter for palliative care: Secondary | ICD-10-CM

## 2013-02-19 DIAGNOSIS — R1314 Dysphagia, pharyngoesophageal phase: Secondary | ICD-10-CM

## 2013-02-19 DIAGNOSIS — K5900 Constipation, unspecified: Secondary | ICD-10-CM

## 2013-02-19 DIAGNOSIS — I5033 Acute on chronic diastolic (congestive) heart failure: Secondary | ICD-10-CM

## 2013-02-19 DIAGNOSIS — F411 Generalized anxiety disorder: Secondary | ICD-10-CM

## 2013-02-19 DIAGNOSIS — R52 Pain, unspecified: Secondary | ICD-10-CM

## 2013-02-19 DIAGNOSIS — I509 Heart failure, unspecified: Secondary | ICD-10-CM

## 2013-02-19 DIAGNOSIS — N390 Urinary tract infection, site not specified: Secondary | ICD-10-CM

## 2013-02-19 NOTE — Assessment & Plan Note (Signed)
Was admitted to [facility] and Palliative care of [facility] hospice services on 02/14/13 for her chf.  She has been grossly underweight for a long time, but had a period of 7 lb weight loss from July 2013 to March 2014 (78#).  She is dyspneic at rest with 2L O2.  Is very weak.

## 2013-02-19 NOTE — Progress Notes (Signed)
Date: 02/19/2013  MRN:  [id number] Name:  Carrie [name] Sex:  female Age:  79 y.o. DOB:[birth year]               Facility/Room:  [name]  Level Of Care:  SNF/long term care/hospice Provider: Kermit Balo DO [facility]  Emergency Contacts: Contact Information   Name Relation Home Work Mobile   [place] Sister [phone]        Code Status:   DNR, on hospice  Allergies:No Known Allergies   Chief Complaint  Patient presents with  . Shortness of Breath    readmission to [facility] now for hospice care after hospitalization 3/9-3/18/14 for acute respiratory failure     HPI:  79 yo female with h/o dementia, failure to thrive, weight loss, generalized decline returns to [facility] on hospice after acute hospitalization for shortness of breath and respiratory failure due to CHF.  See details below.  Past Medical History  Diagnosis Date  . CHF (congestive heart failure)   . 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 Esophageal reflux   . Peripheral vascular disease   . Altered mental status   . Osteoarthrosis   . Hypertension   . Depressive disorder     History reviewed. No pertinent past surgical history.   Procedures:  Hospital procedures reviewed.    Consultants:  [facility]  Current Outpatient Prescriptions  Medication Sig Dispense Refill  . acetaminophen (TYLENOL) 325 MG tablet Take 2 tablets (650 mg total) by mouth every 6 (six) hours as needed for pain or fever.      Marland Kitchen aspirin 81 MG tablet Take 81 mg by mouth daily.      Marland Kitchen atropine 1 % ophthalmic solution 2 DROPS SUBLINGUAL EVERY 4 HOURS AS NEEDED FOR SECRETIONS.  15 mL  0  . bisacodyl (DULCOLAX) 10 MG suppository Place 1 suppository (10 mg total) rectally daily as needed.      . docusate sodium (COLACE) 100 MG capsule Take 100 mg by mouth 2 (two) times daily.      Marland Kitchen escitalopram (LEXAPRO) 10 MG tablet Take 10 mg by mouth daily.      . furosemide (LASIX) 20 MG tablet Take 1 tablet (20 mg total) by  mouth daily.  30 tablet  0  . gabapentin (NEURONTIN) 600 MG tablet Take 600 mg by mouth 3 (three) times daily.      Marland Kitchen LORazepam (ATIVAN) 0.5 MG tablet Take 1 tablet (0.5 mg total) by mouth every 6 (six) hours as needed for anxiety.  20 tablet  0  . Maltodextrin-Xanthan Gum (RESOURCE THICKENUP CLEAR) POWD uSE FOR DIET WITH NECTAR THICK LIQUIDS      . mirtazapine (REMERON) 7.5 MG tablet Take 7.5 mg by mouth at bedtime.      Marland Kitchen morphine (ROXANOL) 20 MG/ML concentrated solution Take 0.43ml by mouth every 3 hours as needed for pain  30 mL  0  . Polyethyl Glycol-Propyl Glycol (SYSTANE) 0.4-0.3 % GEL Apply 1 drop to eye 2 (two) times daily.      . potassium chloride (K-DUR) 10 MEQ tablet Take 10 mEq by mouth daily.      . ranitidine (ZANTAC) 150 MG tablet Take 150 mg by mouth at bedtime.      . tamsulosin (FLOMAX) 0.4 MG CAPS Take 0.4 mg by mouth daily.       No current facility-administered medications for this visit.  the above medicines are being administered at [facility].   There is no immunization history on file  for this patient.   Diet:  Regular diet with nectar thick liquids, aspiration precautions  History  Substance Use Topics  . Smoking status: Former Games developer  . Smokeless tobacco: Not on file  . Alcohol Use: Not on file    History reviewed. No pertinent family history.  Review of Systems  Constitutional: Positive for weight loss and malaise/fatigue. Negative for fever, chills and diaphoresis.  Eyes: Negative for blurred vision.  Respiratory: Positive for cough, shortness of breath and wheezing. Negative for sputum production.   Cardiovascular: Negative for chest pain, palpitations and leg swelling.  Gastrointestinal: Positive for constipation. Negative for heartburn, nausea, vomiting, abdominal pain and diarrhea.  Genitourinary: Negative for dysuria.  Musculoskeletal: Positive for myalgias. Negative for falls.  Skin: Negative for rash.  Neurological: Positive for weakness.  Negative for dizziness and headaches.  Endo/Heme/Allergies: Bruises/bleeds easily.  Psychiatric/Behavioral: Positive for memory loss. The patient is nervous/anxious.       Vital signs: There were no vitals taken for this visit. Physical Exam  Constitutional:  Frail, cachectic Caucasian female in NAD  HENT:  Head: Normocephalic and atraumatic.  Nose: Nose normal.  Mouth/Throat: Oropharynx is clear and moist.  Eyes: Conjunctivae and EOM are normal. Pupils are equal, round, and reactive to light.  Neck: No JVD present. No tracheal deviation present.  Cardiovascular: Normal rate, regular rhythm, normal heart sounds and intact distal pulses.  Exam reveals no gallop and no friction rub.   No murmur heard. Pulmonary/Chest: No stridor.  Increased effort, says she is comfortable, mild expiratory wheezes, prolonged expiratory phase  Abdominal: Soft. Bowel sounds are normal. She exhibits no distension and no mass. There is no tenderness. There is no guarding.  Musculoskeletal: She exhibits no tenderness.  Neurological: She is alert. No cranial nerve deficit. Coordination normal.  Oriented to self only, thought I was someone else  Skin: Skin is warm and dry. She is not diaphoretic.  Psychiatric: She has a normal mood and affect. Cognition and memory are impaired. She exhibits abnormal recent memory.  Pleasantly confused, seems to be delirious      Problem List as of 02/19/2013     ICD-9-CM   Acute respiratory failure with hypoxia   UTI (lower urinary tract infection)   Last Assessment & Plan   02/19/2013 Nursing Home Written 02/19/2013  4:44 PM by Kermit Balo, DO     Completed abx with rocephin.  Has resolved.    Leukocytosis   Hyperkalemia   Anemia   Last Assessment & Plan   02/19/2013 Nursing Home Written 02/19/2013  4:46 PM by Kermit Balo, DO     Has previously been on B12 therapy.  Intake is very poor for a long time.      Coagulopathy   Acute kidney failure   Dementia    Acute diastolic CHF (congestive heart failure)   Transaminasemia   Acute on chronic diastolic CHF (congestive heart failure)   Last Assessment & Plan   02/19/2013 Nursing Home Written 02/19/2013  4:42 PM by Kermit Balo, DO     Was admitted to [facility] and [facility] hospice services on 02/14/13 for her chf.  She has been grossly underweight for a long time, but had a period of 7 lb weight loss from July 2013 to March 2014 (78#).  She is dyspneic at rest with 2L O2.  Is very weak.      Ascites   Generalized weakness   UTI (urinary tract infection)   Hypokalemia  Unspecified constipation   Last Assessment & Plan   02/19/2013 Nursing Home Written 02/19/2013  4:42 PM by Kermit Balo, DO     Stable with use of colace, prn bisacodyl and miralax.    Pain, generalized   Last Assessment & Plan   02/19/2013 Nursing Home Written 02/19/2013  4:48 PM by Kermit Balo, DO     Is now on roxanol therapy for respiratory distress and chronic pain.  Pain is controlled. Being monitored closely by nursing staff and hospice care.  Has appropriate bowel regimen.      Anxiety state, unspecified   Last Assessment & Plan   02/19/2013 Nursing Home Written 02/19/2013  4:46 PM by Kermit Balo, DO     Associated with shortness of breath.  Improves with use of ativan.  Also on lexapro for mood.      Dysphagia, pharyngoesophageal phase   Last Assessment & Plan   02/19/2013 Nursing Home Written 02/19/2013  4:44 PM by Kermit Balo, DO     Aspiration precautions. Is on dysphagia 2(regular) diet with nectar thick liquids.        Infection History: treated for UTI during 3/14 hospitalization Functional assessment:  Dependent in ADLs now Areas of potential improvement: comfort care Rehabilitation Potential:  poor Prognosis for survival:  Poor  Plan:  79 yo female pt with recent hospitalization for acute respiratory failure due to CHF returns now on hospice care  Acute on chronic diastolic CHF  (congestive heart failure) Was admitted to [facility]osp[facility] and Pall[facility] care of [facility] hospice services on 02/14/13 for her chf.  She has been grossly underweight for a long time, but had a period of 7 lb weight loss from July 2013 to March 2014 (78#).  She is dyspneic at rest with 2L O2.  Is very weak.    Unspecified constipation Stable with use of colace, prn bisacodyl and miralax.  Dysphagia, pharyngoesophageal phase Aspiration precautions. Is on dysphagia 2(regular) diet with nectar thick liquids.    UTI (lower urinary tract infection) Completed abx with rocephin.  Has resolved.  Anemia Has previously been on B12 therapy.  Intake is very poor for a long time.    Anxiety state, unspecified Associated with shortness of breath.  Improves with use of ativan.  Also on lexapro for mood.    Pain, generalized Is now on roxanol therapy for respiratory distress and chronic pain.  Pain is controlled. Being monitored closely by nursing staff and hospice care.  Has appropriate bowel regimen.

## 2013-02-19 NOTE — Assessment & Plan Note (Signed)
Completed abx with rocephin.  Has resolved.

## 2013-02-19 NOTE — Assessment & Plan Note (Signed)
Stable with use of colace, prn bisacodyl and miralax.

## 2013-02-19 NOTE — Assessment & Plan Note (Signed)
Is now on roxanol therapy for respiratory distress and chronic pain.  Pain is controlled. Being monitored closely by nursing staff and hospice care.  Has appropriate bowel regimen.

## 2013-02-19 NOTE — Assessment & Plan Note (Signed)
Associated with shortness of breath.  Improves with use of ativan.  Also on lexapro for mood.

## 2013-02-19 NOTE — Assessment & Plan Note (Signed)
Has previously been on B12 therapy.  Intake is very poor for a long time.

## 2013-02-19 NOTE — Assessment & Plan Note (Signed)
Aspiration precautions. Is on dysphagia 2(regular) diet with nectar thick liquids.

## 2013-03-06 NOTE — Consult Note (Signed)
Agree with above 

## 2013-03-25 ENCOUNTER — Encounter: Payer: Self-pay | Admitting: Adult Health

## 2013-03-25 ENCOUNTER — Non-Acute Institutional Stay (SKILLED_NURSING_FACILITY): Payer: Medicare Other | Admitting: Adult Health

## 2013-03-25 DIAGNOSIS — F0390 Unspecified dementia without behavioral disturbance: Secondary | ICD-10-CM

## 2013-03-25 DIAGNOSIS — I5033 Acute on chronic diastolic (congestive) heart failure: Secondary | ICD-10-CM

## 2013-03-25 DIAGNOSIS — F411 Generalized anxiety disorder: Secondary | ICD-10-CM

## 2013-03-25 DIAGNOSIS — R339 Retention of urine, unspecified: Secondary | ICD-10-CM

## 2013-03-25 DIAGNOSIS — I509 Heart failure, unspecified: Secondary | ICD-10-CM

## 2013-03-25 DIAGNOSIS — R1314 Dysphagia, pharyngoesophageal phase: Secondary | ICD-10-CM

## 2013-03-25 DIAGNOSIS — K5900 Constipation, unspecified: Secondary | ICD-10-CM

## 2013-03-25 DIAGNOSIS — R52 Pain, unspecified: Secondary | ICD-10-CM

## 2013-03-25 NOTE — Assessment & Plan Note (Signed)
She is not voicing any complaints of pain at this time is taking neurontin 600 mg three times daily takes roxanol 5 mg every 3 hours as needed

## 2013-03-25 NOTE — Assessment & Plan Note (Addendum)
She is without change in status is followd by hospice care is taking lasix 20 mg daily with k+ 10 meq daily does require chronic O2 use takes asa 81 mg daily

## 2013-03-25 NOTE — Assessment & Plan Note (Signed)
Is stable is taking lexapro 10 mg daily takes ativan 0.5 mg every 6 hours as needed

## 2013-03-25 NOTE — Assessment & Plan Note (Signed)
Is stable is taking colace twice daily  

## 2013-03-25 NOTE — Assessment & Plan Note (Addendum)
No signs of aspiration resent is on nectar thick liquids  Has atropine drops as needed for increased secretions.

## 2013-03-25 NOTE — Assessment & Plan Note (Signed)
Is stable is taking flomax 0.4 mg daily

## 2013-03-25 NOTE — Progress Notes (Signed)
Patient ID: Carrie [name], female   DOB: [birth year], 79 y.o.   MRN: [id number]  Chief Complaint  Patient presents with  . Medical Managment of Chronic Issues    HPI:  Acute on chronic diastolic CHF (congestive heart failure) She is without change in status is followd by hospice care is taking lasix 20 mg daily with k+ 10 meq daily does require chronic O2 use takes asa 81 mg daily   Dysphagia, pharyngoesophageal phase No signs of aspiration resent is on nectar thick liquids  Has atropine drops as needed for increased secretions.   Unspecified constipation Is stable is taking colace twice daily   Dementia No significant changes in status is not taking medications at this times.   Anxiety state, unspecified Is stable is taking lexapro 10 mg daily takes ativan 0.5 mg every 6 hours as needed   Pain, generalized She is not voicing any complaints of pain at this time is taking neurontin 600 mg three times daily takes roxanol 5 mg every 3 hours as needed  Urine retention Is stable is taking flomax 0.4 mg daily    Past Medical History  Diagnosis Date  . CHF (congestive heart failure)   . 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 Esophageal reflux   . Peripheral vascular disease   . Altered mental status   . Osteoarthrosis   . Hypertension   . Depressive disorder     No past surgical history on file.  VITAL SIGNS BP 105/50  Pulse 68  Ht 4\' 10"  (1.473 m)  Wt 75 lb (34.02 kg)  BMI 15.68 kg/m2   Patient's Medications  New Prescriptions   No medications on file  Previous Medications   ACETAMINOPHEN (TYLENOL) 325 MG TABLET    Take 2 tablets (650 mg total) by mouth every 6 (six) hours as needed for pain or fever.   ASPIRIN 81 MG TABLET    Take 81 mg by mouth daily.   ATROPINE 1 % OPHTHALMIC SOLUTION    2 DROPS SUBLINGUAL EVERY 4 HOURS AS NEEDED FOR SECRETIONS.   BISACODYL (DULCOLAX) 10 MG SUPPOSITORY    Place 1 suppository (10 mg total) rectally daily as needed.   DOCUSATE SODIUM (COLACE) 100 MG CAPSULE    Take 100 mg by mouth 2 (two) times daily.   ESCITALOPRAM (LEXAPRO) 10 MG TABLET    Take 10 mg by mouth daily.   FUROSEMIDE (LASIX) 20 MG TABLET    Take 1 tablet (20 mg total) by mouth daily.   GABAPENTIN (NEURONTIN) 600 MG TABLET    Take 600 mg by mouth 3 (three) times daily.   LORAZEPAM (ATIVAN) 0.5 MG TABLET    Take 1 tablet (0.5 mg total) by mouth every 6 (six) hours as needed for anxiety.   MALTODEXTRIN-XANTHAN GUM (RESOURCE THICKENUP CLEAR) POWD    uSE FOR DIET WITH NECTAR THICK LIQUIDS   MIRTAZAPINE (REMERON) 7.5 MG TABLET    Take 7.5 mg by mouth at bedtime.   MORPHINE (ROXANOL) 20 MG/ML CONCENTRATED SOLUTION    Take 0.5ml by mouth every 3 hours as needed for pain   POLYETHYL GLYCOL-PROPYL GLYCOL (SYSTANE) 0.4-0.3 % GEL    Apply 1 drop to eye 2 (two) times daily.   POTASSIUM CHLORIDE (K-DUR) 10 MEQ TABLET    Take 10 mEq by mouth daily.   RANITIDINE (ZANTAC) 150 MG TABLET    Take 150 mg by mouth at bedtime.   TAMSULOSIN (FLOMAX) 0.4 MG CAPS    Take 0.4 mg  by mouth daily.  Modified Medications   No medications on file  Discontinued Medications   No medications on file    SIGNIFICANT DIAGNOSTIC EXAMS     Component Value Date/Time   ALBUMIN 2.4* 02/10/2013 0420   AST 21 02/10/2013 0420   ALT 54* 02/10/2013 0420   ALKPHOS 79 02/10/2013 0420   BILITOT 0.2* 02/10/2013 0420       Component Value Date/Time   BUN 9 02/11/2013 0349   GLUCOSE 72 02/11/2013 0349   CREATININE 0.48* 02/11/2013 0349   K 3.7 02/11/2013 0349   NA 143 02/11/2013 0349   TSH 3.618 02/03/2013 0005       Component Value Date/Time   WBC 7.5 02/10/2013 0420   RBC 4.01 02/10/2013 0420   HGB 9.0* 02/10/2013 0420   HCT 33.1* 02/10/2013 0420   PLT 217 02/10/2013 0420   MCV 82.5 02/10/2013 0420     Review of Systems  Constitutional: Negative.   Respiratory: Negative for cough and shortness of breath.   Cardiovascular: Negative for chest pain.  Gastrointestinal: Negative for  abdominal pain and constipation.  Musculoskeletal: Negative for myalgias and joint pain.  Psychiatric/Behavioral: Negative for depression. The patient does not have insomnia.     Physical Exam  Constitutional:  cathectic   Neck: Neck supple.  Cardiovascular: Normal rate, regular rhythm and intact distal pulses.   Respiratory: Effort normal and breath sounds normal.  GI: Soft. Bowel sounds are normal.  Musculoskeletal:  Is out of bed to wheelchair is able to move extremities  Neurological: She is alert.  Oriented to self   Skin: Skin is warm and dry.  Psychiatric: She has a normal mood and affect.       ASSESSMENT/ PLAN:   Will continue current regimen and current plan of care will continue to monitor her status and will make further changes in the future as indicated.   FUTURE ORDERS:   None at this time

## 2013-03-25 NOTE — Assessment & Plan Note (Signed)
No significant changes in status is not taking medications at this times.

## 2013-04-23 ENCOUNTER — Other Ambulatory Visit: Payer: Self-pay | Admitting: Geriatric Medicine

## 2013-04-23 MED ORDER — MORPHINE SULFATE (CONCENTRATE) 20 MG/ML PO SOLN: ORAL | Status: DC

## 2013-05-13 ENCOUNTER — Encounter: Payer: Self-pay | Admitting: Adult Health

## 2013-05-13 ENCOUNTER — Non-Acute Institutional Stay (SKILLED_NURSING_FACILITY): Payer: Medicare Other | Admitting: Adult Health

## 2013-05-13 DIAGNOSIS — R1314 Dysphagia, pharyngoesophageal phase: Secondary | ICD-10-CM

## 2013-05-13 DIAGNOSIS — R339 Retention of urine, unspecified: Secondary | ICD-10-CM

## 2013-05-13 DIAGNOSIS — I5033 Acute on chronic diastolic (congestive) heart failure: Secondary | ICD-10-CM

## 2013-05-13 DIAGNOSIS — R52 Pain, unspecified: Secondary | ICD-10-CM

## 2013-05-13 DIAGNOSIS — K5900 Constipation, unspecified: Secondary | ICD-10-CM

## 2013-05-13 DIAGNOSIS — I509 Heart failure, unspecified: Secondary | ICD-10-CM

## 2013-05-13 DIAGNOSIS — F411 Generalized anxiety disorder: Secondary | ICD-10-CM

## 2013-05-13 NOTE — Assessment & Plan Note (Signed)
Is stable will continue colace twice daily and dulcolax sup as needed

## 2013-05-13 NOTE — Progress Notes (Signed)
Patient ID: Carrie [name], female   DOB: [birth year], 79 y.o.   MRN: [id number] [facility]   No Known Allergies    Chief Complaint  Patient presents with  . Medical Managment of Chronic Issues    HPI: She is being seen for the management of her chronic illnesses; she is followed by hospice care; there are no concerns being voiced by the staff at this time. She tells me that she is feeling good today.   Past Medical History  Diagnosis Date  . CHF (congestive heart failure)   . 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 Esophageal reflux   . Peripheral vascular disease   . Altered mental status   . Osteoarthrosis   . Hypertension   . Depressive disorder     No past surgical history on file.  VITAL SIGNS BP 111/55  Pulse 59  Ht 4\' 9"  (1.448 m)  Wt 80 lb (36.288 kg)  BMI 17.31 kg/m2   Patient's Medications  New Prescriptions   No medications on file  Previous Medications   ACETAMINOPHEN (TYLENOL) 325 MG TABLET    Take 2 tablets (650 mg total) by mouth every 6 (six) hours as needed for pain or fever.   ASPIRIN 81 MG TABLET    Take 81 mg by mouth daily.   ATROPINE 1 % OPHTHALMIC SOLUTION    2 DROPS SUBLINGUAL EVERY 4 HOURS AS NEEDED FOR SECRETIONS.   BISACODYL (DULCOLAX) 10 MG SUPPOSITORY    Place 1 suppository (10 mg total) rectally daily as needed.   DOCUSATE SODIUM (COLACE) 100 MG CAPSULE    Take 100 mg by mouth 2 (two) times daily.   ESCITALOPRAM (LEXAPRO) 10 MG TABLET    Take 10 mg by mouth daily.   FUROSEMIDE (LASIX) 20 MG TABLET    Take 1 tablet (20 mg total) by mouth daily.   GABAPENTIN (NEURONTIN) 600 MG TABLET    Take 600 mg by mouth 3 (three) times daily.   LORAZEPAM (ATIVAN) 0.5 MG TABLET    Take 1 tablet (0.5 mg total) by mouth every 6 (six) hours as needed for anxiety.   MALTODEXTRIN-XANTHAN GUM (RESOURCE THICKENUP CLEAR) POWD    uSE FOR DIET WITH NECTAR THICK LIQUIDS   MORPHINE (ROXANOL) 20 MG/ML CONCENTRATED SOLUTION    Take 0.41ml by mouth every 3 hours as  needed for pain   POLYETHYL GLYCOL-PROPYL GLYCOL (SYSTANE) 0.4-0.3 % GEL    Apply 1 drop to eye 2 (two) times daily.   POTASSIUM CHLORIDE (K-DUR) 10 MEQ TABLET    Take 10 mEq by mouth daily.   RANITIDINE (ZANTAC) 150 MG TABLET    Take 150 mg by mouth at bedtime.   TAMSULOSIN (FLOMAX) 0.4 MG CAPS    Take 0.4 mg by mouth daily.  Modified Medications   No medications on file  Discontinued Medications   MIRTAZAPINE (REMERON) 7.5 MG TABLET    Take 7.5 mg by mouth at bedtime.    SIGNIFICANT DIAGNOSTIC EXAMS  02-10-13: wbc 7.5; hgb 9.0; hct 331.; mcv 82.5; plt 217; glucose 72; bun 9; creat 0.48; k+ 3.7 Na++ 143; liver normal albumin 2.4; tsh 3.618     Review of Systems  Constitutional: Negative.   Respiratory: Negative for cough and shortness of breath.   Cardiovascular: Negative for chest pain.  Gastrointestinal: Negative for abdominal pain and constipation.  Musculoskeletal: Negative for myalgias and joint pain.  Psychiatric/Behavioral: Negative for depression. The patient does not have insomnia.     Physical Exam  Constitutional:  cathectic   Neck: Neck supple.  Cardiovascular: Normal rate, regular rhythm and intact distal pulses.   Respiratory: Effort normal and breath sounds normal.  GI: Soft. Bowel sounds are normal.  Musculoskeletal:  Is out of bed to wheelchair is able to move extremities  Neurological: She is alert.  Oriented to self   Skin: Skin is warm and dry.  Psychiatric: She has a normal mood and affect.      ASSESSMENT/ PLAN:  Urine retention Is stable will continue flomax 0.4 mg daily   Unspecified constipation Is stable will continue colace twice daily and dulcolax sup as needed   Dysphagia, pharyngoesophageal phase Stable no signs of aspiration present is tolerating nectar thick liquids; will continue atropine drops as needed for excessive secretions. And will monitor her status   Acute on chronic diastolic CHF (congestive heart failure) She remains  stable will continue lasix 20 mg daily with k+ 10 meq daily and asa 81 mg daily   Pain, generalized Her pain is well managed with neurontin 600 mg three times daily and roxanol 5 mg every 3 hours as needed   Anxiety state, unspecified She is stable will continue lexapro 10 mg daily with ativan 0.5 mg every 6 hours as needed for increased anxiety

## 2013-05-13 NOTE — Assessment & Plan Note (Signed)
She remains stable will continue lasix 20 mg daily with k+ 10 meq daily and asa 81 mg daily

## 2013-05-13 NOTE — Assessment & Plan Note (Signed)
Is stable will continue flomax 0.4 mg daily

## 2013-05-13 NOTE — Assessment & Plan Note (Signed)
Stable no signs of aspiration present is tolerating nectar thick liquids; will continue atropine drops as needed for excessive secretions. And will monitor her status

## 2013-05-13 NOTE — Assessment & Plan Note (Signed)
She is stable will continue lexapro 10 mg daily with ativan 0.5 mg every 6 hours as needed for increased anxiety

## 2013-05-13 NOTE — Assessment & Plan Note (Signed)
Her pain is well managed with neurontin 600 mg three times daily and roxanol 5 mg every 3 hours as needed

## 2013-06-20 ENCOUNTER — Other Ambulatory Visit: Payer: Self-pay | Admitting: Geriatric Medicine

## 2013-06-20 MED ORDER — MORPHINE SULFATE (CONCENTRATE) 20 MG/ML PO SOLN: ORAL | Status: DC

## 2013-07-16 ENCOUNTER — Encounter: Payer: Self-pay | Admitting: Internal Medicine

## 2013-07-16 ENCOUNTER — Non-Acute Institutional Stay (SKILLED_NURSING_FACILITY): Payer: Medicare Other | Admitting: Internal Medicine

## 2013-07-16 DIAGNOSIS — R627 Adult failure to thrive: Secondary | ICD-10-CM

## 2013-07-16 DIAGNOSIS — R52 Pain, unspecified: Secondary | ICD-10-CM

## 2013-07-16 DIAGNOSIS — R531 Weakness: Secondary | ICD-10-CM

## 2013-07-16 DIAGNOSIS — R5383 Other fatigue: Secondary | ICD-10-CM

## 2013-07-16 DIAGNOSIS — Z515 Encounter for palliative care: Secondary | ICD-10-CM

## 2013-07-16 DIAGNOSIS — L89109 Pressure ulcer of unspecified part of back, unspecified stage: Secondary | ICD-10-CM

## 2013-07-16 DIAGNOSIS — F0390 Unspecified dementia without behavioral disturbance: Secondary | ICD-10-CM

## 2013-07-16 DIAGNOSIS — R5381 Other malaise: Secondary | ICD-10-CM

## 2013-07-16 DIAGNOSIS — I5032 Chronic diastolic (congestive) heart failure: Secondary | ICD-10-CM

## 2013-07-16 DIAGNOSIS — L8990 Pressure ulcer of unspecified site, unspecified stage: Secondary | ICD-10-CM

## 2013-07-16 DIAGNOSIS — L89159 Pressure ulcer of sacral region, unspecified stage: Secondary | ICD-10-CM

## 2013-07-16 DIAGNOSIS — B370 Candidal stomatitis: Secondary | ICD-10-CM

## 2013-07-16 NOTE — Progress Notes (Signed)
Patient ID: Carrie [name], female   DOB: September 08, 1925, 79 y.o.   MRN: [id number] Location:  Location:  [name] Provider:  Gwenith Spitz. Renato Gails, D.O., C.M.D.  Code Status:  DNR, on hospice care   Chief Complaint  Patient presents with  . Acute Visit    sore tongue    HPI:  79 yo female with h/o end stage diastolic chf class 4 on oxygen and hospice care, dementia, frailty, dysphagia, chronic leukocytosis was seen for acute visit due to possible thrush on her tongue causing increased pain with eating.  She eats very little.  She is chronically short of breath and has some chronic abdominal pain.  She c/o buttock pain today where she has a sacral decubitus ulcer.    Review of Systems:  Review of Systems  Constitutional: Positive for weight loss and malaise/fatigue. Negative for fever and chills.  HENT: Negative for congestion.   Eyes: Negative for blurred vision.  Respiratory: Positive for shortness of breath. Negative for cough, sputum production and wheezing.   Cardiovascular: Negative for chest pain and leg swelling.  Gastrointestinal: Negative for heartburn and constipation.  Genitourinary: Negative for dysuria.  Musculoskeletal: Positive for myalgias and back pain.  Skin: Positive for itching.  Neurological: Positive for weakness. Negative for dizziness and headaches.  Endo/Heme/Allergies: Bruises/bleeds easily.  Psychiatric/Behavioral: Positive for depression and memory loss.    Medications: Patient's Medications  New Prescriptions   No medications on file  Previous Medications   ACETAMINOPHEN (TYLENOL) 325 MG TABLET    Take 2 tablets (650 mg total) by mouth every 6 (six) hours as needed for pain or fever.   ASPIRIN 81 MG TABLET    Take 81 mg by mouth daily.   ATROPINE 1 % OPHTHALMIC SOLUTION    2 DROPS SUBLINGUAL EVERY 4 HOURS AS NEEDED FOR SECRETIONS.   BISACODYL (DULCOLAX) 10 MG SUPPOSITORY    Place 1 suppository (10 mg total) rectally daily as needed.   DOCUSATE SODIUM (COLACE) 100 MG CAPSULE    Take 100 mg by mouth 2 (two) times daily.   ESCITALOPRAM (LEXAPRO) 10 MG TABLET    Take 10 mg by mouth daily.   FUROSEMIDE (LASIX) 20 MG TABLET    Take 1 tablet (20 mg total) by mouth daily.   GABAPENTIN (NEURONTIN) 600 MG TABLET    Take 600 mg by mouth 3 (three) times daily.   LORAZEPAM (ATIVAN) 0.5 MG TABLET    Take 1 tablet (0.5 mg total) by mouth every 6 (six) hours as needed for anxiety.   MALTODEXTRIN-XANTHAN GUM (RESOURCE THICKENUP CLEAR) POWD    uSE FOR DIET WITH NECTAR THICK LIQUIDS   MORPHINE (ROXANOL) 20 MG/ML CONCENTRATED SOLUTION    Take 0.27ml by mouth every 3 hours as needed for pain   POLYETHYL GLYCOL-PROPYL GLYCOL (SYSTANE) 0.4-0.3 % GEL    Apply 1 drop to eye 2 (two) times daily.   POTASSIUM CHLORIDE (K-DUR) 10 MEQ TABLET    Take 10 mEq by mouth daily.   RANITIDINE (ZANTAC) 150 MG TABLET    Take 150 mg by mouth at bedtime.   TAMSULOSIN (FLOMAX) 0.4 MG CAPS    Take 0.4 mg by mouth daily.  Modified Medications   No medications on file  Discontinued Medications   No medications on file    Physical Exam: Filed Vitals:   07/16/13 2108  BP: 141/54  Pulse: 54  Temp: 97.5 F (36.4 C)  Resp: 18  SpO2: 97%   Physical Exam  Constitutional:  Frail elderly female, pursed lip breathing, pale  HENT:  Head: Normocephalic and atraumatic.  Cerumen visible in right external ear and canal  Eyes: EOM are normal. Pupils are equal, round, and reactive to light.  Wears glasses, poor vision  Cardiovascular: Normal rate, regular rhythm and normal heart sounds.   Pulmonary/Chest: Effort normal and breath sounds normal.  Abdominal: Soft. Bowel sounds are normal. She exhibits no distension. There is tenderness.  Musculoskeletal: She exhibits no edema.  Neurological: She is alert.  Oriented to person only at this point  Skin: No rash noted. There is pallor.  Sacral decubitus ulcer present     Labs reviewed: Basic Metabolic Panel:  Recent  Labs  02/03/13 0005  02/04/13 0340 02/05/13 0442  02/09/13 0443 02/10/13 0420 02/11/13 0349  NA 134*  < > 139 142  < > 142 144 143  K 4.2  < > 3.3* 2.2*  < > 3.7 3.7 3.7  CL 93*  < > 98 96  < > 101 105 102  CO2 28  < > 33* 39*  < > 32 34* 38*  GLUCOSE 144*  < > 106* 100*  < > 75 86 72  BUN 59*  < > 44* 21  < > 9 10 9   CREATININE 1.42*  < > 1.01 0.65  < > 0.47* 0.45* 0.48*  CALCIUM 8.3*  < > 7.9* 8.0*  < > 8.9 8.9 8.7  MG 2.0  --  2.0 1.8  --   --   --   --   PHOS 6.9*  --   --   --   --   --   --   --   < > = values in this interval not displayed.  Liver Function Tests:  Recent Labs  02/06/13 0408 02/08/13 0428 02/10/13 0420  AST 68* 30 21  ALT 158* 89* 54*  ALKPHOS 77 82 79  BILITOT 0.2* 0.3 0.2*  PROT 5.2* 5.2* 5.0*  ALBUMIN 2.4* 2.5* 2.4*    CBC:  Recent Labs  02/02/13 1644 02/03/13 0005  02/08/13 0428 02/09/13 0443 02/10/13 0420  WBC 11.9* 10.1  < > 8.7 9.4 7.5  NEUTROABS 9.5* 8.2*  --   --   --   --   HGB 8.8* 8.6*  < > 9.3* 9.7* 9.0*  HCT 30.8* 30.2*  < > 33.7* 35.5* 33.1*  MCV 78.6 78.0  < > 80.6 80.7 82.5  PLT 352 296  < > 209 255 217  < > = values in this interval not displayed. Assessment/Plan No problem-specific assessment & plan notes found for this encounter. 1. Dementia Severe at this point, pleasant, and does speak, but severe failure to thrive more related to her CHF  2. Generalized weakness Due to CHF, failure to thrive, dementia On hospice care  3. Failure to thrive syndrome, adult Progressing, continues to lose weight, is very weak  4. Pain, generalized Is typically controlled with her current regimen of tylenol and gabapentin, followed by hospice--appreciate their help with pain and shortness of breath management  5. Chronic diastolic CHF (congestive heart failure), NYHA class 4 SOB well managed.  Inactive, stays in bed majority of the time due to pain and dyspnea.    6.  Sacral decubitus ulcer Being followed by wound care  physician and treatment nurse, as well as hospice service.     7.  Thrush:  Prescribed nystatin swish and swallow for a week to be swabbed on her tongue.  Family/ staff Communication: sister was present during visit, discussed with nursing staff  Goals of care: DNR, hospice care  Labs/tests ordered: none due to goals of care

## 2013-07-17 ENCOUNTER — Encounter: Payer: Self-pay | Admitting: Internal Medicine

## 2013-07-17 DIAGNOSIS — R627 Adult failure to thrive: Secondary | ICD-10-CM | POA: Insufficient documentation

## 2013-07-17 DIAGNOSIS — I5032 Chronic diastolic (congestive) heart failure: Secondary | ICD-10-CM | POA: Insufficient documentation

## 2013-07-21 ENCOUNTER — Encounter: Payer: Self-pay | Admitting: Internal Medicine

## 2013-07-21 ENCOUNTER — Non-Acute Institutional Stay (SKILLED_NURSING_FACILITY): Payer: Medicare Other | Admitting: Internal Medicine

## 2013-07-21 DIAGNOSIS — H6691 Otitis media, unspecified, right ear: Secondary | ICD-10-CM

## 2013-07-21 DIAGNOSIS — H6690 Otitis media, unspecified, unspecified ear: Secondary | ICD-10-CM

## 2013-07-21 NOTE — Assessment & Plan Note (Signed)
Diff Dx is infection vs shingles-will not know that until she had vesicles on surrounding skin;i the meantime will cover with septra D BID and ciprofloxin otic0.2% q 12 hours for 7 days

## 2013-07-21 NOTE — Progress Notes (Signed)
MRN: [id number] Name: Carrie [name]  Sex: female Age: 79 y.o. DOB: 26-Jan-1925  [facility] #: [facility] Facility/Room:  213B Level Of Care: SNF Provider: Merrilee Seashore D Emergency Contacts: Extended Emergency Contact Information Primary Emergency Contact: [facility] Address: [address]          [place], [place] [address] [place] Home Phone: [phone] Relation: Sister  Allergies: Review of patient's allergies indicates no known allergies.  Chief Complaint  Patient presents with  . Ear Problem    HPI: Patient is 79 y.o. female who was found by nurse this am redness and rash to ear.  Past Medical History  Diagnosis Date  . CHF (congestive heart failure)   . 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 Esophageal reflux   . Peripheral vascular disease   . Altered mental status   . Osteoarthrosis   . Hypertension   . Depressive disorder     No past surgical history on file.    Medication List       This list is accurate as of: 07/21/13 12:15 PM.  Always use your most recent med list.               acetaminophen 325 MG tablet  Commonly known as:  TYLENOL  Take 2 tablets (650 mg total) by mouth every 6 (six) hours as needed for pain or fever.     aspirin 81 MG tablet  Take 81 mg by mouth daily.     atropine 1 % ophthalmic solution  2 DROPS SUBLINGUAL EVERY 4 HOURS AS NEEDED FOR SECRETIONS.     bisacodyl 10 MG suppository  Commonly known as:  DULCOLAX  Place 1 suppository (10 mg total) rectally daily as needed.     docusate sodium 100 MG capsule  Commonly known as:  COLACE  Take 100 mg by mouth 2 (two) times daily.     escitalopram 10 MG tablet  Commonly known as:  LEXAPRO  Take 10 mg by mouth daily.     furosemide 20 MG tablet  Commonly known as:  LASIX  Take 1 tablet (20 mg total) by mouth daily.     gabapentin 600 MG tablet  Commonly known as:  NEURONTIN  Take 600 mg by mouth 3 (three) times daily.     LORazepam 0.5 MG  tablet  Commonly known as:  ATIVAN  Take 1 tablet (0.5 mg total) by mouth every 6 (six) hours as needed for anxiety.     morphine 20 MG/ML concentrated solution  Commonly known as:  ROXANOL  Take 0.42ml by mouth every 3 hours as needed for pain     potassium chloride 10 MEQ tablet  Commonly known as:  K-DUR  Take 10 mEq by mouth daily.     ranitidine 150 MG tablet  Commonly known as:  ZANTAC  Take 150 mg by mouth at bedtime.     RESOURCE THICKENUP CLEAR Powd  uSE FOR DIET WITH NECTAR THICK LIQUIDS     SYSTANE 0.4-0.3 % Gel  Generic drug:  Polyethyl Glycol-Propyl Glycol  Apply 1 drop to eye 2 (two) times daily.     tamsulosin 0.4 MG Caps capsule  Commonly known as:  FLOMAX  Take 0.4 mg by mouth daily.        No orders of the defined types were placed in this encounter.     There is no immunization history on file for this patient.  History  Substance Use Topics  . Smoking status:  Former Smoker  . Smokeless tobacco: Not on file  . Alcohol Use: Not on file    Family history is noncontributory    Review of Systems  DATA OBTAINED: from patient, nurse  - admits that ear hurts;senies dizzy, nausea  Filed Vitals:   07/21/13 1147  BP: 101/55  Pulse: 74  Temp: 98.2 F (36.8 C)  Resp: 18    Physical Exam  GENERAL APPEARANCE:awake , responsive  SKIN: No diaphoresis; superficial abrasion to skin R lower abd, looks like from diaper HEENT- R ear- mild red, mild swelling to pinna and proximal canal;multiple flat purulent vesicles ant pinna-NO VESICLES SEEN ON SKIN OF HEAD,SCALP OR NECK RESPIRATORY: Breathing is even, unlabored. Lung sounds are coarse   CARDIOVASCULAR: Heart RRR no murmurs, rubs or gallops. No peripheral edema.  GASTROINTESTINAL: Abdomen is soft, non-tender, not distended w/ normal bowel sounds.  MUSCULOSKELETAL: No abnormal joints or musculature NEUROLOGIC: no acute changes  Patient Active Problem List   Diagnosis Date Noted  . Acute ear  infection 07/21/2013  . Failure to thrive syndrome, adult 07/17/2013  . Chronic diastolic CHF (congestive heart failure), NYHA class 4 07/17/2013  . Urine retention 03/25/2013  . Dysphagia, pharyngoesophageal phase 02/10/2013  . Unspecified constipation 02/09/2013  . Pain, generalized 02/09/2013  . Anxiety state, unspecified 02/09/2013  . UTI (urinary tract infection) 02/05/2013  . Hypokalemia 02/05/2013  . Acute on chronic diastolic CHF (congestive heart failure) 02/04/2013  . Ascites 02/04/2013  . Generalized weakness 02/04/2013  . Acute diastolic CHF (congestive heart failure) 02/03/2013  . Transaminasemia 02/03/2013  . Acute respiratory failure with hypoxia 02/02/2013  . UTI (lower urinary tract infection) 02/02/2013  . Leukocytosis 02/02/2013  . Hyperkalemia 02/02/2013  . Anemia 02/02/2013  . Coagulopathy 02/02/2013  . Acute kidney failure 02/02/2013  . Dementia 02/02/2013    Functional assessment:   CBC    Component Value Date/Time   WBC 7.5 02/10/2013 0420   RBC 4.01 02/10/2013 0420   HGB 9.0* 02/10/2013 0420   HCT 33.1* 02/10/2013 0420   PLT 217 02/10/2013 0420   MCV 82.5 02/10/2013 0420   LYMPHSABS 0.6* 02/03/2013 0005   MONOABS 1.3* 02/03/2013 0005   EOSABS 0.0 02/03/2013 0005   BASOSABS 0.0 02/03/2013 0005    CMP     Component Value Date/Time   NA 143 02/11/2013 0349   K 3.7 02/11/2013 0349   CL 102 02/11/2013 0349   CO2 38* 02/11/2013 0349   GLUCOSE 72 02/11/2013 0349   BUN 9 02/11/2013 0349   CREATININE 0.48* 02/11/2013 0349   CALCIUM 8.7 02/11/2013 0349   PROT 5.0* 02/10/2013 0420   ALBUMIN 2.4* 02/10/2013 0420   AST 21 02/10/2013 0420   ALT 54* 02/10/2013 0420   ALKPHOS 79 02/10/2013 0420   BILITOT 0.2* 02/10/2013 0420   GFRNONAA 87* 02/11/2013 0349   GFRAA >90 02/11/2013 0349    Assessment and Plan  Acute ear infection Diff Dx is infection vs shingles-will not know that until she had vesicles on surrounding skin;i the meantime will cover with septra D BID and  ciprofloxin otic0.2% q 12 hours for 7 days    Margit Hanks, MD

## 2013-07-22 ENCOUNTER — Non-Acute Institutional Stay (SKILLED_NURSING_FACILITY): Payer: Medicare Other | Admitting: Internal Medicine

## 2013-07-22 ENCOUNTER — Encounter: Payer: Self-pay | Admitting: Internal Medicine

## 2013-07-22 DIAGNOSIS — G510 Bell's palsy: Secondary | ICD-10-CM | POA: Insufficient documentation

## 2013-07-22 DIAGNOSIS — H6690 Otitis media, unspecified, unspecified ear: Secondary | ICD-10-CM

## 2013-07-22 DIAGNOSIS — H109 Unspecified conjunctivitis: Secondary | ICD-10-CM

## 2013-07-22 DIAGNOSIS — H6691 Otitis media, unspecified, right ear: Secondary | ICD-10-CM

## 2013-07-22 NOTE — Assessment & Plan Note (Signed)
Pt's pinna looks MUCH better today.Swelling is improved as is the generalized erythema. The pus filled blisters are dried up. There have been no lesions nearby that might suggest shingles.

## 2013-07-22 NOTE — Assessment & Plan Note (Signed)
Pt has h/o same because she is touching her skin and face all the time;made more likely because eye doesn't close because of Bell's palsy. She says she won't use drops so we will try garamycin ointment once a day if she lets [place] tape her eye;4 times a day if she won't let us tape it until clear

## 2013-07-22 NOTE — Assessment & Plan Note (Addendum)
Clearly sudden onset of paresis to R side of face including forehead as pt was seen by me yesterday. Pt's sister is here today and I discussed what BP was, possible treatments, outcomes etc. Pt is in hospice care and sister agreed with use only of eye drops and taping eye closed if pt will let us;explained at length to pt and her sister the importance of taping the eye closed

## 2013-07-22 NOTE — Progress Notes (Signed)
MRN: [id number] Name: Carrie [name]  Sex: female Age: 79 y.o. DOB: Apr 26, 1925  [facility] #[facility] Facility/Room: 213 B Level Of Care: SNF Provider: Merrilee Seashore D Emergency Contacts: Extended Emergency Contact Information Primary Emergency Contact: [facility] Address: [address]          [place], [place] [address] [place] Home Phone: [phone] Relation: Sister  Code Status: DNR  Allergies: Review of patient's allergies indicates no known allergies.  Chief Complaint  Patient presents with  . Acute Visit    HPI: Patient is 79 y.o. female who has had onset of R side facial weakness since yesterday. Pt was seen just yesterday for an acute R pinna infection  Past Medical History  Diagnosis Date  . CHF (congestive heart failure)   . 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 Esophageal reflux   . Peripheral vascular disease   . Altered mental status   . Osteoarthrosis   . Hypertension   . Depressive disorder     History reviewed. No pertinent past surgical history.    Medication List       This list is accurate as of: 07/22/13  8:19 PM.  Always use your most recent med list.               acetaminophen 325 MG tablet  Commonly known as:  TYLENOL  Take 2 tablets (650 mg total) by mouth every 6 (six) hours as needed for pain or fever.     aspirin 81 MG tablet  Take 81 mg by mouth daily.     atropine 1 % ophthalmic solution  2 DROPS SUBLINGUAL EVERY 4 HOURS AS NEEDED FOR SECRETIONS.     bisacodyl 10 MG suppository  Commonly known as:  DULCOLAX  Place 1 suppository (10 mg total) rectally daily as needed.     CIPRO HC otic suspension  Generic drug:  ciprofloxacin-hydrocortisone  Place 3 drops into the right ear QID. cipro only -(no hydrocortisone but wasn't an epic choice)     docusate sodium 100 MG capsule  Commonly known as:  COLACE  Take 100 mg by mouth 2 (two) times daily.     escitalopram 10 MG tablet  Commonly known  as:  LEXAPRO  Take 10 mg by mouth daily.     furosemide 20 MG tablet  Commonly known as:  LASIX  Take 1 tablet (20 mg total) by mouth daily.     gabapentin 600 MG tablet  Commonly known as:  NEURONTIN  Take 600 mg by mouth 3 (three) times daily.     LORazepam 0.5 MG tablet  Commonly known as:  ATIVAN  Take 1 tablet (0.5 mg total) by mouth every 6 (six) hours as needed for anxiety.     morphine 20 MG/ML concentrated solution  Commonly known as:  ROXANOL  Take 0.87ml by mouth every 3 hours as needed for pain     potassium chloride 10 MEQ tablet  Commonly known as:  K-DUR  Take 10 mEq by mouth daily.     ranitidine 150 MG tablet  Commonly known as:  ZANTAC  Take 150 mg by mouth at bedtime.     RESOURCE THICKENUP CLEAR Powd  uSE FOR DIET WITH NECTAR THICK LIQUIDS     sulfamethoxazole-trimethoprim 800-160 MG per tablet  Commonly known as:  BACTRIM DS  Take 1 tablet by mouth 2 (two) times daily.     SYSTANE 0.4-0.3 % Gel  Generic drug:  Polyethyl Glycol-Propyl Glycol  Apply 1 drop to eye 2 (two) times daily.     tamsulosin 0.4 MG Caps capsule  Commonly known as:  FLOMAX  Take 0.4 mg by mouth daily.        Meds ordered this encounter  Medications  . ciprofloxacin-hydrocortisone (CIPRO HC) otic suspension    Sig: Place 3 drops into the right ear QID. cipro only -(no hydrocortisone but wasn't an epic choice)  . sulfamethoxazole-trimethoprim (BACTRIM DS) 800-160 MG per tablet    Sig: Take 1 tablet by mouth 2 (two) times daily.     There is no immunization history on file for this patient.  History  Substance Use Topics  . Smoking status: Former Games developer  . Smokeless tobacco: Not on file  . Alcohol Use: Not on file    Family history is noncontributory    Review of Systems  DATA OBTAINED: from patient, nurse  no fevers, cough cold, change in swallowing or change in upper or lower extremity strength; does act like R eye is hurting  Filed Vitals:   07/22/13 1958   BP: 132/60  Pulse: 60  Temp: 97.1 F (36.2 C)  Resp: 16    Physical Exam  GENERAL APPEARANCE: Alert, constantly rubbing R eye SKIN: No diaphoresis  HEAD: Normocephalic, atraumatic  EYES: Conjunctiva minimally injected; thin white /green d/c in inner canthus EOMs intact. Obvious weakness,almost paralysis of lids on R EARS: Pinna- swelling much improved, peripheral erythema is gone and central erythema is less;blisters are scabbing;overall very improved from yesterday NOSE: No deformity or discharge.  MOUTH/THROAT: Lips w/o lesions. R mouth droop with slight secretion control problem.  RESPIRATORY: Breathing is even, unlabored on O2   NEUROLOGIC: Oriented X3. Cranial nerves 2-12 grossly intact with exception of R face- no forehead folds at all, eyelids with no perceivable movement, very slack R cheek and R lip Moves all extremities equally, no tremor.   Patient Active Problem List   Diagnosis Date Noted  . Bell's palsy 07/22/2013  . Conjunctivitis 07/22/2013  . Acute ear infection 07/21/2013  . Failure to thrive syndrome, adult 07/17/2013  . Chronic diastolic CHF (congestive heart failure), NYHA class 4 07/17/2013  . Urine retention 03/25/2013  . Dysphagia, pharyngoesophageal phase 02/10/2013  . Unspecified constipation 02/09/2013  . Pain, generalized 02/09/2013  . Anxiety state, unspecified 02/09/2013  . UTI (urinary tract infection) 02/05/2013  . Hypokalemia 02/05/2013  . Acute on chronic diastolic CHF (congestive heart failure) 02/04/2013  . Ascites 02/04/2013  . Generalized weakness 02/04/2013  . Acute diastolic CHF (congestive heart failure) 02/03/2013  . Transaminasemia 02/03/2013  . Acute respiratory failure with hypoxia 02/02/2013  . UTI (lower urinary tract infection) 02/02/2013  . Leukocytosis 02/02/2013  . Hyperkalemia 02/02/2013  . Anemia 02/02/2013  . Coagulopathy 02/02/2013  . Acute kidney failure 02/02/2013  . Dementia 02/02/2013       Assessment and  Plan  Conjunctivitis Pt has h/o same because she is touching her skin and face all the time;made more likely because eye doesn't close because of Bell's palsy. She says she won't use drops so we will try garamycin ointment once a day if she lets [place] tape her eye;4 times a day if she won't let us tape it until clear  Bell's palsy Clearly sudden onset of paresis to R side of face including forehead as pt was seen by me yesterday. Pt's sister is here today and I discussed what BP was, possible treatments, outcomes etc. Pt is in hospice care and sister agreed with  use only of eye drops and taping eye closed if pt will let us.  Acute ear infection Pt's pinna looks MUCH better today.Swelling is improved as is the generalized erythema. The pus filled blisters are dried up. There have been no lesions nearby that might suggest shingles.    Margit Hanks, MD

## 2013-08-19 ENCOUNTER — Encounter: Payer: Self-pay | Admitting: Internal Medicine

## 2013-08-19 ENCOUNTER — Non-Acute Institutional Stay (SKILLED_NURSING_FACILITY): Payer: Medicare Other | Admitting: Internal Medicine

## 2013-08-19 DIAGNOSIS — H109 Unspecified conjunctivitis: Secondary | ICD-10-CM

## 2013-08-19 DIAGNOSIS — I5032 Chronic diastolic (congestive) heart failure: Secondary | ICD-10-CM

## 2013-08-19 DIAGNOSIS — F411 Generalized anxiety disorder: Secondary | ICD-10-CM

## 2013-08-19 DIAGNOSIS — J9600 Acute respiratory failure, unspecified whether with hypoxia or hypercapnia: Secondary | ICD-10-CM

## 2013-08-19 DIAGNOSIS — R52 Pain, unspecified: Secondary | ICD-10-CM

## 2013-08-19 DIAGNOSIS — J9601 Acute respiratory failure with hypoxia: Secondary | ICD-10-CM

## 2013-08-19 NOTE — Assessment & Plan Note (Signed)
Pt's VS are stable and we would not intervene much per pt wishes;Hospice was there today and expressed surprise that she was doing as well as she was.

## 2013-08-19 NOTE — Assessment & Plan Note (Signed)
Lexapro and ativan prn are being used

## 2013-08-19 NOTE — Progress Notes (Signed)
MRN: [id number] Name: Maira Christon  Sex: female Age: 79 y.o. DOB: 1925/02/25  PSC #: Ronni Rumble Facility/Room: 213B Level Of Care: SNF Provider: Merrilee Seashore D Emergency Contacts: Extended Emergency Contact Information Primary Emergency Contact: [facility] Address: [address]          [place], [place] [address] [place] Home Phone: [phone] Relation: Sister  Code Status: DNR, Hospice  Allergies: Review of patient's allergies indicates no known allergies.  Chief Complaint  Patient presents with  . Acute Visit  . Medical Managment of Chronic Issues    HPI: Patient is 79 y.o. female who has end stage CHR, class 4, on O2 and hospice care, dementia, frailty, dysphagia, chronic leukocytosis, chronic SOB, chronic abd pain who is fine with all that today but c/o pain R eye and nurse reports redness and that pt is rubbing her eye constantly. Pt has h/o having hands on face frequently.  Past Medical History  Diagnosis Date  . CHF (congestive heart failure)   . 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 Esophageal reflux   . Peripheral vascular disease   . Altered mental status   . Osteoarthrosis   . Hypertension   . Depressive disorder     History reviewed. No pertinent past surgical history.    Medication List       This list is accurate as of: 08/19/13  3:33 PM.  Always use your most recent med list.               acetaminophen 325 MG tablet  Commonly known as:  TYLENOL  Take 2 tablets (650 mg total) by mouth every 6 (six) hours as needed for pain or fever.     aspirin 81 MG tablet  Take 81 mg by mouth daily.     atropine 1 % ophthalmic solution  2 DROPS SUBLINGUAL EVERY 4 HOURS AS NEEDED FOR SECRETIONS.     bisacodyl 10 MG suppository  Commonly known as:  DULCOLAX  Place 1 suppository (10 mg total) rectally daily as needed.     docusate sodium 100 MG capsule  Commonly known as:  COLACE  Take 100 mg by mouth 2 (two) times  daily.     escitalopram 10 MG tablet  Commonly known as:  LEXAPRO  Take 10 mg by mouth daily.     furosemide 20 MG tablet  Commonly known as:  LASIX  Take 1 tablet (20 mg total) by mouth daily.     gabapentin 600 MG tablet  Commonly known as:  NEURONTIN  Take 600 mg by mouth 3 (three) times daily.     LORazepam 0.5 MG tablet  Commonly known as:  ATIVAN  Take 1 tablet (0.5 mg total) by mouth every 6 (six) hours as needed for anxiety.     morphine 20 MG/ML concentrated solution  Commonly known as:  ROXANOL  Take 0.43ml by mouth every 3 hours as needed for pain     potassium chloride 10 MEQ tablet  Commonly known as:  K-DUR  Take 10 mEq by mouth daily.     ranitidine 150 MG tablet  Commonly known as:  ZANTAC  Take 150 mg by mouth at bedtime.     RESOURCE THICKENUP CLEAR Powd  uSE FOR DIET WITH NECTAR THICK LIQUIDS     SYSTANE 0.4-0.3 % Gel  Generic drug:  Polyethyl Glycol-Propyl Glycol  Apply 1 drop to eye 2 (two) times daily.     tamsulosin 0.4 MG Caps capsule  Commonly known as:  FLOMAX  Take 0.4 mg by mouth daily.        No orders of the defined types were placed in this encounter.     There is no immunization history on file for this patient.  History  Substance Use Topics  . Smoking status: Former Games developer  . Smokeless tobacco: Not on file  . Alcohol Use: Not on file    Family history is noncontributory    Review of Systems  DATA OBTAINED: from patient, nurse, family member GENERAL: no change from baseline, has some c/o pain earlier but has had pain meds SKIN: No itching, rash or wounds EYES: + eye pain, redness, discharge EARS: No earache, tinnitus, change in hearing NOSE: No congestion, drainage or bleeding  MOUTH/THROAT: No mouth or tooth pain, No sore throat, No difficulty chewing or swallowing  RESPIRATORY: No cough, no wheezing, always SOB CARDIAC: No chest pain, palpitations, lower extremity edema  GI: No abdominal pain, No N/V/D or  constipation, No heartburn or reflux  GU: No dysuria, frequency or urgency, or incontinence  MUSCULOSKELETAL: No unrelieved bone/joint pain NEUROLOGIC: Awake, alert, appropriate to situation, No change in mental status. Moves all four, no focal deficits PSYCHIATRIC: No overt anxiety or sadness. Sleeps well. No behavior issue.  AMBULATION:    Filed Vitals:   08/19/13 1505  BP: 126/61  Pulse: 67  Temp: 98.9 F (37.2 C)  Resp: 17    Physical Exam  GENERAL APPEARANCE: Alert, rubbing eye continuously.  SKIN: No diaphoresis rash, or wounds HEAD: Normocephalic, atraumatic  EYES: Conjunctiva red with mild white exudate EARS: External exam WNL, canals clear. Hearing grossly normal.  NOSE: No deformity or discharge.  MOUTH/THROAT: Lips w/o lesions. Mouth and throat normal. RESPIRATORY: Breathing is even, unlabored. Lung sounds are diffusely decreased, no wheezing   CARDIOVASCULAR: Heart RRR distant with a gallop No peripheral edema.  VENOUS: No varicosities. No venous stasis skin changes  GASTROINTESTINAL: Abdomen is soft, non-tender, not distended w/ normal bowel sounds. GENITOURINARY: Bladder non tender, not distended  MUSCULOSKELETAL: contractures LE NEUROLOGIC: no c/o PSYCHIATRIC: dementia affect  Patient Active Problem List   Diagnosis Date Noted  . Bell's palsy 07/22/2013  . Conjunctivitis 07/22/2013  . Acute ear infection 07/21/2013  . Failure to thrive syndrome, adult 07/17/2013  . Chronic diastolic CHF (congestive heart failure), NYHA class 4 07/17/2013  . Urine retention 03/25/2013  . Dysphagia, pharyngoesophageal phase 02/10/2013  . Unspecified constipation 02/09/2013  . Pain, generalized 02/09/2013  . Anxiety state, unspecified 02/09/2013  . UTI (urinary tract infection) 02/05/2013  . Hypokalemia 02/05/2013  . Acute on chronic diastolic CHF (congestive heart failure) 02/04/2013  . Ascites 02/04/2013  . Generalized weakness 02/04/2013  . Acute diastolic CHF  (congestive heart failure) 02/03/2013  . Transaminasemia 02/03/2013  . Acute respiratory failure with hypoxia 02/02/2013  . UTI (lower urinary tract infection) 02/02/2013  . Leukocytosis 02/02/2013  . Hyperkalemia 02/02/2013  . Anemia 02/02/2013  . Coagulopathy 02/02/2013  . Acute kidney failure 02/02/2013  . Dementia 02/02/2013     CBC    Component Value Date/Time   WBC 7.5 02/10/2013 0420   RBC 4.01 02/10/2013 0420   HGB 9.0* 02/10/2013 0420   HCT 33.1* 02/10/2013 0420   PLT 217 02/10/2013 0420   MCV 82.5 02/10/2013 0420   LYMPHSABS 0.6* 02/03/2013 0005   MONOABS 1.3* 02/03/2013 0005   EOSABS 0.0 02/03/2013 0005   BASOSABS 0.0 02/03/2013 0005    CMP     Component Value  Date/Time   NA 143 02/11/2013 0349   K 3.7 02/11/2013 0349   CL 102 02/11/2013 0349   CO2 38* 02/11/2013 0349   GLUCOSE 72 02/11/2013 0349   BUN 9 02/11/2013 0349   CREATININE 0.48* 02/11/2013 0349   CALCIUM 8.7 02/11/2013 0349   PROT 5.0* 02/10/2013 0420   ALBUMIN 2.4* 02/10/2013 0420   AST 21 02/10/2013 0420   ALT 54* 02/10/2013 0420   ALKPHOS 79 02/10/2013 0420   BILITOT 0.2* 02/10/2013 0420   GFRNONAA 87* 02/11/2013 0349   GFRAA >90 02/11/2013 0349    Assessment and Plan  Conjunctivitis Pt had a bout last month and per sister it has been present since, just not as bad, or bothersome. Pt consented to drops this time so will order garamycin drops QID and prn  Chronic diastolic CHF (congestive heart failure), NYHA class 4 Pt's VS are stable and we would not intervene much per pt wishes;Hospice was there today and expressed surprise that she was doing as well as she was.  Acute respiratory failure with hypoxia Same as above;pt has chronic O2 and nebs as needed. She is at baseline and comfortable  Pain, generalized Pt has pain meds prn and I expect her to use them whenever desired-I want her to be comfortable  Anxiety state, unspecified Lexapro and ativan prn are being used  PT IS HOSPICE AND WILL TREAT FOR NEW  OR UNCOMFORTABLE SYMPTOMS BUT NO ROUTINE BLOOD WORK OR INTERVENTIONS  Margit Hanks, MD

## 2013-08-19 NOTE — Assessment & Plan Note (Signed)
Pt had a bout last month and per sister it has been present since, just not as bad, or bothersome. Pt consented to drops this time so will order garamycin drops QID and prn

## 2013-08-19 NOTE — Assessment & Plan Note (Signed)
Pt has pain meds prn and I expect her to use them whenever desired-I want her to be comfortable

## 2013-08-19 NOTE — Assessment & Plan Note (Signed)
Same as above;pt has chronic O2 and nebs as needed. She is at baseline and comfortable

## 2013-10-29 ENCOUNTER — Non-Acute Institutional Stay (SKILLED_NURSING_FACILITY): Payer: Medicare Other | Admitting: Internal Medicine

## 2013-10-29 DIAGNOSIS — I5032 Chronic diastolic (congestive) heart failure: Secondary | ICD-10-CM

## 2013-10-29 DIAGNOSIS — R5381 Other malaise: Secondary | ICD-10-CM

## 2013-10-29 DIAGNOSIS — F0390 Unspecified dementia without behavioral disturbance: Secondary | ICD-10-CM

## 2013-10-29 DIAGNOSIS — R531 Weakness: Secondary | ICD-10-CM

## 2013-10-29 DIAGNOSIS — R52 Pain, unspecified: Secondary | ICD-10-CM

## 2013-10-29 DIAGNOSIS — R5383 Other fatigue: Secondary | ICD-10-CM

## 2013-10-29 DIAGNOSIS — R627 Adult failure to thrive: Secondary | ICD-10-CM

## 2013-10-29 NOTE — Progress Notes (Signed)
Patient ID: Carrie [name], female   DOB: 12-22-1924, 79 y.o.   MRN: [id number]  Location:  [name] Provider:  Gwenith Spitz. Renato Gails, D.O., C.M.D.  Code Status:  DNR, on hospice care   Chief Complaint  Patient presents with  . Medical Managment of Chronic Issues    hospice pt    HPI:  79 yo female with h/o end stage diastolic chf class 4 on oxygen and hospice care, dementia, frailty, dysphagia, chronic leukocytosis was seen for medical mgt of chronic diseases. She continues to lose weight.    Review of Systems:  Review of Systems  Constitutional: Positive for weight loss and malaise/fatigue. Negative for fever and chills.  HENT: Negative for congestion.   Eyes: Negative for blurred vision.  Respiratory: Positive for shortness of breath. Negative for cough, sputum production and wheezing.   Cardiovascular: Negative for chest pain and leg swelling.  Gastrointestinal: Negative for heartburn and constipation.  Genitourinary: Negative for dysuria.  Musculoskeletal: Positive for back pain and myalgias.  Skin: Positive for itching.  Neurological: Positive for weakness. Negative for dizziness and headaches.  Endo/Heme/Allergies: Bruises/bleeds easily.  Psychiatric/Behavioral: Positive for depression and memory loss.    Medications: Patient's Medications  New Prescriptions   No medications on file  Previous Medications   ACETAMINOPHEN (TYLENOL) 325 MG TABLET    Take 2 tablets (650 mg total) by mouth every 6 (six) hours as needed for pain or fever.   ASPIRIN 81 MG TABLET    Take 81 mg by mouth daily.   ATROPINE 1 % OPHTHALMIC SOLUTION    2 DROPS SUBLINGUAL EVERY 4 HOURS AS NEEDED FOR SECRETIONS.   BISACODYL (DULCOLAX) 10 MG SUPPOSITORY    Place 1 suppository (10 mg total) rectally daily as needed.   DOCUSATE SODIUM (COLACE) 100 MG CAPSULE    Take 100 mg by mouth 2 (two) times daily.   ESCITALOPRAM (LEXAPRO) 10 MG TABLET    Take 10 mg by mouth daily.   FUROSEMIDE (LASIX) 20  MG TABLET    Take 1 tablet (20 mg total) by mouth daily.   GABAPENTIN (NEURONTIN) 600 MG TABLET    Take 600 mg by mouth 3 (three) times daily.   LORAZEPAM (ATIVAN) 0.5 MG TABLET    Take 1 tablet (0.5 mg total) by mouth every 6 (six) hours as needed for anxiety.   MALTODEXTRIN-XANTHAN GUM (RESOURCE THICKENUP CLEAR) POWD    uSE FOR DIET WITH NECTAR THICK LIQUIDS   MORPHINE (ROXANOL) 20 MG/ML CONCENTRATED SOLUTION    Take 0.79ml by mouth every 3 hours as needed for pain   POLYETHYL GLYCOL-PROPYL GLYCOL (SYSTANE) 0.4-0.3 % GEL    Apply 1 drop to eye 2 (two) times daily.   POTASSIUM CHLORIDE (K-DUR) 10 MEQ TABLET    Take 10 mEq by mouth daily.   RANITIDINE (ZANTAC) 150 MG TABLET    Take 150 mg by mouth at bedtime.   TAMSULOSIN (FLOMAX) 0.4 MG CAPS    Take 0.4 mg by mouth daily.  Modified Medications   No medications on file  Discontinued Medications   No medications on file    Physical Exam: Filed Vitals:   10/29/13 1807  BP: 102/56  Pulse: 56  Temp: 97 F (36.1 C)  Resp: 16  SpO2: 96%   Physical Exam  Constitutional:  Frail elderly female, pursed lip breathing, pale  HENT:  Head: Normocephalic and atraumatic.  Cerumen visible in right external ear and canal  Eyes: EOM are normal. Pupils are equal,  round, and reactive to light.  Wears glasses, poor vision  Cardiovascular: Normal rate, regular rhythm and normal heart sounds.   Pulmonary/Chest: Effort normal and breath sounds normal.  Abdominal: Soft. Bowel sounds are normal. She exhibits no distension. There is tenderness.  Musculoskeletal: She exhibits no edema.  Neurological: She is alert.  Oriented to person only at this point  Skin: No rash noted. There is pallor.  Sacral decubitus ulcer present     Labs reviewed: Basic Metabolic Panel:  Recent Labs  16/10/96 0005  02/04/13 0340 02/05/13 0442  02/09/13 0443 02/10/13 0420 02/11/13 0349  NA 134*  < > 139 142  < > 142 144 143  K 4.2  < > 3.3* 2.2*  < > 3.7 3.7 3.7   CL 93*  < > 98 96  < > 101 105 102  CO2 28  < > 33* 39*  < > 32 34* 38*  GLUCOSE 144*  < > 106* 100*  < > 75 86 72  BUN 59*  < > 44* 21  < > 9 10 9   CREATININE 1.42*  < > 1.01 0.65  < > 0.47* 0.45* 0.48*  CALCIUM 8.3*  < > 7.9* 8.0*  < > 8.9 8.9 8.7  MG 2.0  --  2.0 1.8  --   --   --   --   PHOS 6.9*  --   --   --   --   --   --   --   < > = values in this interval not displayed.  Liver Function Tests:  Recent Labs  02/06/13 0408 02/08/13 0428 02/10/13 0420  AST 68* 30 21  ALT 158* 89* 54*  ALKPHOS 77 82 79  BILITOT 0.2* 0.3 0.2*  PROT 5.2* 5.2* 5.0*  ALBUMIN 2.4* 2.5* 2.4*    CBC:  Recent Labs  02/02/13 1644 02/03/13 0005  02/08/13 0428 02/09/13 0443 02/10/13 0420  WBC 11.9* 10.1  < > 8.7 9.4 7.5  NEUTROABS 9.5* 8.2*  --   --   --   --   HGB 8.8* 8.6*  < > 9.3* 9.7* 9.0*  HCT 30.8* 30.2*  < > 33.7* 35.5* 33.1*  MCV 78.6 78.0  < > 80.6 80.7 82.5  PLT 352 296  < > 209 255 217  < > = values in this interval not displayed. Assessment/Plan 1. Dementia Severe at this point, pleasant, and does speak, but severe failure to thrive more related to her CHF  2. Generalized weakness Due to CHF, failure to thrive, dementia On hospice care  3. Failure to thrive syndrome, adult Progressing, continues to lose weight, is very weak  4. Pain, generalized Cont tylenol and gabapentin, followed by hospice  5. Chronic diastolic CHF (congestive heart failure), NYHA class 4 SOB well managed. Remains in bed    6.  Sacral decubitus ulcer Being followed by wound care physician and treatment nurse, as well as hospice service.     Goals of care: DNR, hospice care  Labs/tests ordered: none due to goals of care

## 2013-11-04 ENCOUNTER — Encounter: Payer: Self-pay | Admitting: Internal Medicine

## 2013-11-04 ENCOUNTER — Non-Acute Institutional Stay (SKILLED_NURSING_FACILITY): Payer: Medicare Other | Admitting: Internal Medicine

## 2013-11-04 DIAGNOSIS — G510 Bell's palsy: Secondary | ICD-10-CM

## 2013-11-04 DIAGNOSIS — H109 Unspecified conjunctivitis: Secondary | ICD-10-CM

## 2013-11-04 NOTE — Assessment & Plan Note (Signed)
Pt has had some improvement with tone of face on right but eye, upper and lower lid , have no movement and appears have been unable to close completely even taped and so some damage to eye; I don't think pt can see out of it but she is unaware that she can't.

## 2013-11-04 NOTE — Progress Notes (Signed)
MRN: [id number] Name: Carrie [name]  Sex: female Age: 79 y.o. DOB: 1925-07-12  PSC #: Ronni Rumble Facility/Room: 213B Level Of Care: SNF Provider: Merrilee Seashore D Emergency Contacts: Extended Emergency Contact Information Primary Emergency Contact: [facility] Address: [address]          [place], [place] [address] [place] Home Phone: [phone] Relation: Sister  Code Status: DNR  Allergies: Review of patient's allergies indicates no known allergies.  Chief Complaint  Patient presents with  . Acute Visit    HPI: Patient is 79 y.o. female who the hospice nurse asked me to see because she is having green discharge in her R eye.  Past Medical History  Diagnosis Date  . CHF (congestive heart failure)   . 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 Esophageal reflux   . Peripheral vascular disease   . Altered mental status   . Osteoarthrosis   . Hypertension   . Depressive disorder     History reviewed. No pertinent past surgical history.    Medication List       This list is accurate as of: 11/04/13 12:52 PM.  Always use your most recent med list.               acetaminophen 325 MG tablet  Commonly known as:  TYLENOL  Take 2 tablets (650 mg total) by mouth every 6 (six) hours as needed for pain or fever.     aspirin 81 MG tablet  Take 81 mg by mouth daily.     atropine 1 % ophthalmic solution  2 DROPS SUBLINGUAL EVERY 4 HOURS AS NEEDED FOR SECRETIONS.     bisacodyl 10 MG suppository  Commonly known as:  DULCOLAX  Place 1 suppository (10 mg total) rectally daily as needed.     docusate sodium 100 MG capsule  Commonly known as:  COLACE  Take 100 mg by mouth 2 (two) times daily.     escitalopram 10 MG tablet  Commonly known as:  LEXAPRO  Take 10 mg by mouth daily.     furosemide 20 MG tablet  Commonly known as:  LASIX  Take 1 tablet (20 mg total) by mouth daily.     gabapentin 600 MG tablet  Commonly known as:   NEURONTIN  Take 600 mg by mouth 3 (three) times daily.     LORazepam 0.5 MG tablet  Commonly known as:  ATIVAN  Take 1 tablet (0.5 mg total) by mouth every 6 (six) hours as needed for anxiety.     morphine 20 MG/ML concentrated solution  Commonly known as:  ROXANOL  Take 0.72ml by mouth every 3 hours as needed for pain     potassium chloride 10 MEQ tablet  Commonly known as:  K-DUR  Take 10 mEq by mouth daily.     ranitidine 150 MG tablet  Commonly known as:  ZANTAC  Take 150 mg by mouth at bedtime.     RESOURCE THICKENUP CLEAR Powd  uSE FOR DIET WITH NECTAR THICK LIQUIDS     SYSTANE 0.4-0.3 % Gel  Generic drug:  Polyethyl Glycol-Propyl Glycol  Apply 1 drop to eye 2 (two) times daily.     tamsulosin 0.4 MG Caps capsule  Commonly known as:  FLOMAX  Take 0.4 mg by mouth daily.        No orders of the defined types were placed in this encounter.     There is no immunization history on file for this patient.  History  Substance Use Topics  . Smoking status: Former Games developer  . Smokeless tobacco: Not on file  . Alcohol Use: Not on file    Review of Systems  DATA OBTAINED: from patient, nurse; PT DENIES PAIN, JUST WANTS TO KNOW IF IT IS RED GENERAL: Feels well no fevers, fatigue, appetite changes SKIN: No itching, rasH HEENT - reported red conjunctiva with green discharge  RESPIRATORY: No cough, wheezing, SOB CARDIAC: No chest pain, palpitations, lower extremity edema  GI: No abdominal pain, No N/V/D or constipation, No heartburn or reflux  GU: No dysuria, frequency or urgency, or incontinence  MUSCULOSKELETAL: No unrelieved bone/joint pain NEUROLOGIC: No headache, dizziness o PSYCHIATRIC: No overt anxiety or sadness. Sleeps well.   Filed Vitals:   11/04/13 1230  BP: 121/63  Pulse: 63  Temp: 97.8 F (36.6 C)  Resp: 18    Physical Exam  GENERAL APPEARANCE: Alert, conversant.getting a bath, No acute distress cachectic SKIN: No diaphoresis rash HEENT:  TAPED CLOSED WITH GUAZE - ON REMOVAL R EYE HAS  RED CONJUNCTIVA, GREEN D/C, INFILTRATE ON CORNEA VS SCAR  RESPIRATORY: Breathing is even, unlabored. Lung sounds are clear   CARDIOVASCULAR: Heart RRR no murmurs, rubs or gallops. No peripheral edema  GASTROINTESTINAL: Abdomen is soft, non-tender, not distended w/ normal bowel sounds.  GENITOURINARY: Bladder non tender, not distended  MUSCULOSKELETAL: contractures NEUROLOGIC: R side facial droop still evident but better PSYCHIATRIC: Mood and affect appropriate to situation, no behavioral issues  Patient Active Problem List   Diagnosis Date Noted  . Bell's palsy 07/22/2013  . Conjunctivitis 07/22/2013  . Acute ear infection 07/21/2013  . Failure to thrive syndrome, adult 07/17/2013  . Chronic diastolic CHF (congestive heart failure), NYHA class 4 07/17/2013  . Urine retention 03/25/2013  . Dysphagia, pharyngoesophageal phase 02/10/2013  . Unspecified constipation 02/09/2013  . Pain, generalized 02/09/2013  . Anxiety state, unspecified 02/09/2013  . UTI (urinary tract infection) 02/05/2013  . Hypokalemia 02/05/2013  . Acute on chronic diastolic CHF (congestive heart failure) 02/04/2013  . Ascites 02/04/2013  . Generalized weakness 02/04/2013  . Acute diastolic CHF (congestive heart failure) 02/03/2013  . Transaminasemia 02/03/2013  . Acute respiratory failure with hypoxia 02/02/2013  . UTI (lower urinary tract infection) 02/02/2013  . Leukocytosis 02/02/2013  . Hyperkalemia 02/02/2013  . Anemia 02/02/2013  . Coagulopathy 02/02/2013  . Acute kidney failure 02/02/2013  . Dementia 02/02/2013    CBC    Component Value Date/Time   WBC 7.5 02/10/2013 0420   RBC 4.01 02/10/2013 0420   HGB 9.0* 02/10/2013 0420   HCT 33.1* 02/10/2013 0420   PLT 217 02/10/2013 0420   MCV 82.5 02/10/2013 0420   LYMPHSABS 0.6* 02/03/2013 0005   MONOABS 1.3* 02/03/2013 0005   EOSABS 0.0 02/03/2013 0005   BASOSABS 0.0 02/03/2013 0005    CMP     Component  Value Date/Time   NA 143 02/11/2013 0349   K 3.7 02/11/2013 0349   CL 102 02/11/2013 0349   CO2 38* 02/11/2013 0349   GLUCOSE 72 02/11/2013 0349   BUN 9 02/11/2013 0349   CREATININE 0.48* 02/11/2013 0349   CALCIUM 8.7 02/11/2013 0349   PROT 5.0* 02/10/2013 0420   ALBUMIN 2.4* 02/10/2013 0420   AST 21 02/10/2013 0420   ALT 54* 02/10/2013 0420   ALKPHOS 79 02/10/2013 0420   BILITOT 0.2* 02/10/2013 0420   GFRNONAA 87* 02/11/2013 0349   GFRAA >90 02/11/2013 0349    Assessment and Plan  Conjunctivitis Green discharge with infiltrate/scarring  on cornea- even with right eye being taped closed has become dry and scarred, probable, vs infiltrate. Normally would use cipro on this but 2 problems #1 pt won't allow drops and it will take a while to get ointment and I'm afraid ointment will be prior auth which will further delay #2 pt is end of life, I think damage has already ben done with eye from the Bell's palsy. THEREFORE will use garamycin ointment as before, q 2 hours while awake for 48 hours then QID;tape eye shut at night only for 48 hours  Bell's palsy Pt has had some improvement with tone of face on right but eye, upper and lower lid , have no movement and appears have been unable to close completely even taped and so some damage to eye; I don't think pt can see out of it but she is unaware that she can't.    Margit Hanks, MD

## 2013-11-04 NOTE — Assessment & Plan Note (Signed)
Green discharge with infiltrate/scarring on cornea- even with right eye being taped closed has become dry and scarred, probable, vs infiltrate. Normally would use cipro on this but 2 problems #1 pt won't allow drops and it will take a while to get ointment and I'm afraid ointment will be prior auth which will further delay #2 pt is end of life, I think damage has already ben done with eye from the Bell's palsy. THEREFORE will use garamycin ointment as before, q 2 hours while awake for 48 hours then QID;tape eye shut at night only for 48 hours

## 2013-12-31 ENCOUNTER — Non-Acute Institutional Stay (SKILLED_NURSING_FACILITY): Payer: Medicare Other | Admitting: Internal Medicine

## 2013-12-31 DIAGNOSIS — F0390 Unspecified dementia without behavioral disturbance: Secondary | ICD-10-CM

## 2013-12-31 DIAGNOSIS — R627 Adult failure to thrive: Secondary | ICD-10-CM

## 2013-12-31 DIAGNOSIS — I5032 Chronic diastolic (congestive) heart failure: Secondary | ICD-10-CM

## 2013-12-31 DIAGNOSIS — K5900 Constipation, unspecified: Secondary | ICD-10-CM

## 2013-12-31 DIAGNOSIS — R531 Weakness: Secondary | ICD-10-CM

## 2013-12-31 DIAGNOSIS — R5381 Other malaise: Secondary | ICD-10-CM

## 2013-12-31 DIAGNOSIS — R52 Pain, unspecified: Secondary | ICD-10-CM

## 2013-12-31 DIAGNOSIS — R5383 Other fatigue: Secondary | ICD-10-CM

## 2013-12-31 NOTE — Progress Notes (Signed)
Patient ID: Carrie [name], female   DOB: 10/28/1925, 80 y.o.   MRN: [id number]  [facility]  Provider:  Gwenith Spitziffany L. Renato Gailseed, D.O., C.M.D.  Code Status: DNR, hospice  Chief Complaint  Patient presents with  . Medical Management of Chronic Issues    hospice pt    HPI:  80 yo female with h/o chronic diastolic chf with respiratory failure, dysphagia, chronic constipation, anemia, generalized weakness and failure to thrive was seen for medical mgt of her chronic diseases.  She continues to lose weight.  She spends her days in bed.  She has some chronic pain that is controlled with her tylenol and gabapentin.  She is on a regular bowel regimen.  She is being followed by hospice for her heart failure.    Review of Systems:  Review of Systems  Constitutional: Negative for fever and chills.  HENT: Negative for congestion.   Respiratory: Negative for shortness of breath.        No change in cough or sputum production  Cardiovascular: Negative for chest pain and leg swelling.  Gastrointestinal: Positive for constipation. Negative for abdominal pain.       Controlled with bowel regimen, does not eat much  Genitourinary: Negative for dysuria.  Musculoskeletal: Positive for back pain. Negative for falls.  Skin: Negative for rash.  Neurological: Negative for dizziness.  Endo/Heme/Allergies: Bruises/bleeds easily.  Psychiatric/Behavioral: Positive for depression.       Some confusion intermittently on bad days    Medications: Patient's Medications  New Prescriptions   No medications on file  Previous Medications   ACETAMINOPHEN (TYLENOL) 325 MG TABLET    Take 2 tablets (650 mg total) by mouth every 6 (six) hours as needed for pain or fever.   ASPIRIN 81 MG TABLET    Take 81 mg by mouth daily.   ATROPINE 1 % OPHTHALMIC SOLUTION    2 DROPS SUBLINGUAL EVERY 4 HOURS AS NEEDED FOR SECRETIONS.   BISACODYL (DULCOLAX) 10 MG SUPPOSITORY    Place 1 suppository (10 mg total)  rectally daily as needed.   DOCUSATE SODIUM (COLACE) 100 MG CAPSULE    Take 100 mg by mouth 2 (two) times daily.   ESCITALOPRAM (LEXAPRO) 10 MG TABLET    Take 10 mg by mouth daily.   FUROSEMIDE (LASIX) 20 MG TABLET    Take 1 tablet (20 mg total) by mouth daily.   GABAPENTIN (NEURONTIN) 600 MG TABLET    Take 600 mg by mouth 3 (three) times daily.   LORAZEPAM (ATIVAN) 0.5 MG TABLET    Take 1 tablet (0.5 mg total) by mouth every 6 (six) hours as needed for anxiety.   MALTODEXTRIN-XANTHAN GUM (RESOURCE THICKENUP CLEAR) POWD    uSE FOR DIET WITH NECTAR THICK LIQUIDS   POLYETHYL GLYCOL-PROPYL GLYCOL (SYSTANE) 0.4-0.3 % GEL    Apply 1 drop to eye 2 (two) times daily.   POTASSIUM CHLORIDE (K-DUR) 10 MEQ TABLET    Take 10 mEq by mouth daily.   RANITIDINE (ZANTAC) 150 MG TABLET    Take 150 mg by mouth at bedtime.   TAMSULOSIN (FLOMAX) 0.4 MG CAPS    Take 0.4 mg by mouth daily.  Modified Medications   Modified Medication Previous Medication   MORPHINE (ROXANOL) 20 MG/ML CONCENTRATED SOLUTION morphine (ROXANOL) 20 MG/ML concentrated solution      Take 0.7025ml by mouth every 3 hours as needed for pain    Take 0.4325ml by mouth every 3 hours as needed for pain  Discontinued Medications   No medications on file    Physical Exam: There were no vitals filed for this visit. Physical Exam  Constitutional:  Cachectic female resting in bed  Cardiovascular: Normal rate, regular rhythm, normal heart sounds and intact distal pulses.   Pulmonary/Chest: Breath sounds normal.  Increased respiratory effort (chronic)   Abdominal: Soft. Bowel sounds are normal. She exhibits no distension and no mass. There is no tenderness.  Musculoskeletal: She exhibits no edema.  Neurological: She is alert.  Skin: Skin is warm and dry.  Psychiatric: She has a normal mood and affect.     Labs reviewed: Last labs 3/14--pt is on hospice  Assessment/Plan 1. Chronic diastolic CHF (congestive heart failure), NYHA class 4 -hospice  diagnosis, cont medications for comfort purposes with lasix, potassium, anxiety and pain meds to help with breathing and atropine for secretions if needed  2. Dementia, without behavioral disturbance -waxes and wanes, may be more of an end of life delirium in her case and due to pain meds, anxiety meds  3. Pain, generalized -controlled with tylenol, neurontin and prn roxanol  4. Failure to thrive syndrome, adult -due to her chf and dementia  5. Generalized weakness -due to #1 and overall frailty  6. Unspecified constipation -controlled with bowel regimen   Family/ staff Communication: discussed with nurse Goals of care: DNR, hospice care, avoid hospitalization, comfort measures  Labs/tests ordered:  none

## 2014-01-23 ENCOUNTER — Encounter: Payer: Self-pay | Admitting: Internal Medicine

## 2014-01-29 ENCOUNTER — Other Ambulatory Visit: Payer: Self-pay | Admitting: *Deleted

## 2014-01-29 MED ORDER — MORPHINE SULFATE (CONCENTRATE) 20 MG/ML PO SOLN: ORAL | Status: AC

## 2014-01-29 NOTE — Telephone Encounter (Signed)
[facility] [place] 

## 2014-03-11 ENCOUNTER — Non-Acute Institutional Stay (SKILLED_NURSING_FACILITY): Payer: Medicare Other | Admitting: Internal Medicine

## 2014-03-11 DIAGNOSIS — I5032 Chronic diastolic (congestive) heart failure: Secondary | ICD-10-CM

## 2014-03-11 DIAGNOSIS — G510 Bell's palsy: Secondary | ICD-10-CM

## 2014-03-11 DIAGNOSIS — G309 Alzheimer's disease, unspecified: Secondary | ICD-10-CM

## 2014-03-11 DIAGNOSIS — F0280 Dementia in other diseases classified elsewhere without behavioral disturbance: Secondary | ICD-10-CM

## 2014-03-11 DIAGNOSIS — R627 Adult failure to thrive: Secondary | ICD-10-CM

## 2014-03-11 DIAGNOSIS — R52 Pain, unspecified: Secondary | ICD-10-CM

## 2014-03-11 NOTE — Progress Notes (Signed)
Patient ID: Carrie [name], female   DOB: [birth year], 80 y.o.   MRN: [id number]  Provider:  Gwenith Spitziffany L. Renato Gailseed, D.O., C.M.D.  [facility]  PCP: Bufford SpikesEED, Urho Rio, DO  Code Status: DNR   No Known Allergies  Chief Complaint  Patient presents with  . Medical Managment of Chronic Issues    HPI: 80 y.o. female on hospice care was seen for medical mgt of chronic diseases.  Hospice notes reviewed.  She has bell's palsy causing her right eye not to close and this has caused irritation and pain.  Her sister wanted to know when this will resolve which is unpredictable at this point.  Lasts days to mos.   ROS: Review of Systems  Constitutional: Negative for fever.  HENT: Negative for congestion.   Eyes: Positive for photophobia, pain, discharge and redness. Negative for blurred vision.  Respiratory: Negative for shortness of breath.   Cardiovascular: Negative for chest pain.  Gastrointestinal: Negative for abdominal pain.  Genitourinary: Negative for dysuria.  Musculoskeletal: Negative for falls.  Skin: Negative for rash.  Neurological: Negative for dizziness.       Bell's palsy right  Psychiatric/Behavioral: Positive for memory loss.    Past Medical History  Diagnosis Date  . CHF (congestive heart failure)   . 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 Esophageal reflux   . Peripheral vascular disease   . Altered mental status   . Osteoarthrosis   . Hypertension   . Depressive disorder    No past surgical history on file. Social History:   reports that she has quit smoking. She does not have any smokeless tobacco history on file. Her alcohol and drug histories are not on file.  No family history on file.  Medications: Patient's Medications  New Prescriptions   No medications on file  Previous Medications   ACETAMINOPHEN (TYLENOL) 325 MG TABLET    Take 2 tablets (650 mg total) by mouth every 6 (six) hours as needed for pain or fever.   ASPIRIN 81  MG TABLET    Take 81 mg by mouth daily.   ATROPINE 1 % OPHTHALMIC SOLUTION    2 DROPS SUBLINGUAL EVERY 4 HOURS AS NEEDED FOR SECRETIONS.   BISACODYL (DULCOLAX) 10 MG SUPPOSITORY    Place 1 suppository (10 mg total) rectally daily as needed.   DOCUSATE SODIUM (COLACE) 100 MG CAPSULE    Take 100 mg by mouth 2 (two) times daily.   ESCITALOPRAM (LEXAPRO) 10 MG TABLET    Take 10 mg by mouth daily.   FUROSEMIDE (LASIX) 20 MG TABLET    Take 1 tablet (20 mg total) by mouth daily.   GABAPENTIN (NEURONTIN) 600 MG TABLET    Take 600 mg by mouth 3 (three) times daily.   LORAZEPAM (ATIVAN) 0.5 MG TABLET    Take 1 tablet (0.5 mg total) by mouth every 6 (six) hours as needed for anxiety.   MALTODEXTRIN-XANTHAN GUM (RESOURCE THICKENUP CLEAR) POWD    uSE FOR DIET WITH NECTAR THICK LIQUIDS   MORPHINE (ROXANOL) 20 MG/ML CONCENTRATED SOLUTION    Take 0.1325ml by mouth every 3 hours as needed for pain   POLYETHYL GLYCOL-PROPYL GLYCOL (SYSTANE) 0.4-0.3 % GEL    Apply 1 drop to eye 2 (two) times daily.   POTASSIUM CHLORIDE (K-DUR) 10 MEQ TABLET    Take 10 mEq by mouth daily.   RANITIDINE (ZANTAC) 150 MG TABLET    Take 150 mg by mouth at bedtime.   TAMSULOSIN (  FLOMAX) 0.4 MG CAPS    Take 0.4 mg by mouth daily.  Modified Medications   No medications on file  Discontinued Medications   No medications on file     Physical Exam: Filed Vitals:   03/11/14 1128  BP: 118/62  Pulse: 62  Temp: 97.1 F (36.2 C)  Resp: 16  SpO2: 93%   Physical Exam  Constitutional:  Cachectic female resting in bed  Eyes:  Right eye with dressing over it to keep it closed, also getting ointment to it  Cardiovascular: Normal rate, regular rhythm and normal heart sounds.   Pulmonary/Chest: She has wheezes. She has no rales.  Increased effort  Abdominal: Soft. Bowel sounds are normal.  Musculoskeletal:  Developing contractures, bedbound  Neurological: She is alert.  Skin: Skin is warm and dry.     Labs reviewed: No recent labs  due to goals of care  Imaging and Procedures: No recent imaging/procedures due to goals of care  Assessment/Plan 1. Bell's palsy -cont ointment and moisture to right eye and try to keep closed to prevent irritation and corneal scarring from occuring, help with comfort -unclear if this will resolve at this point with her degree of immunocompromise  2. Chronic diastolic CHF (congestive heart failure), NYHA class 4 -on hospice care for this, end stage -cachectic and poor intake from this, AD and emphysema 3. Alzheimer's disease -moderate, memory pretty good, but other illnesses sever with chf and copd  4. Pain, generalized -cont current regimen  5. Failure to thrive syndrome, adult Severe, continues to lose weight and decline despite supplements and assistance with meals, is on hospice care   Functional status:  Dependent in all ADLs  Family/ staff Communication: discussed with nursing staff  Labs/tests ordered:  none

## 2014-03-14 IMAGING — CR DG CHEST 1V PORT
1 series · 1 of 1 positions shown · non-contrast
Comparison: 01/15/2010

CLINICAL DATA: Bradycardia, history of CHF

PORTABLE CHEST - 1 VIEW

[AP]
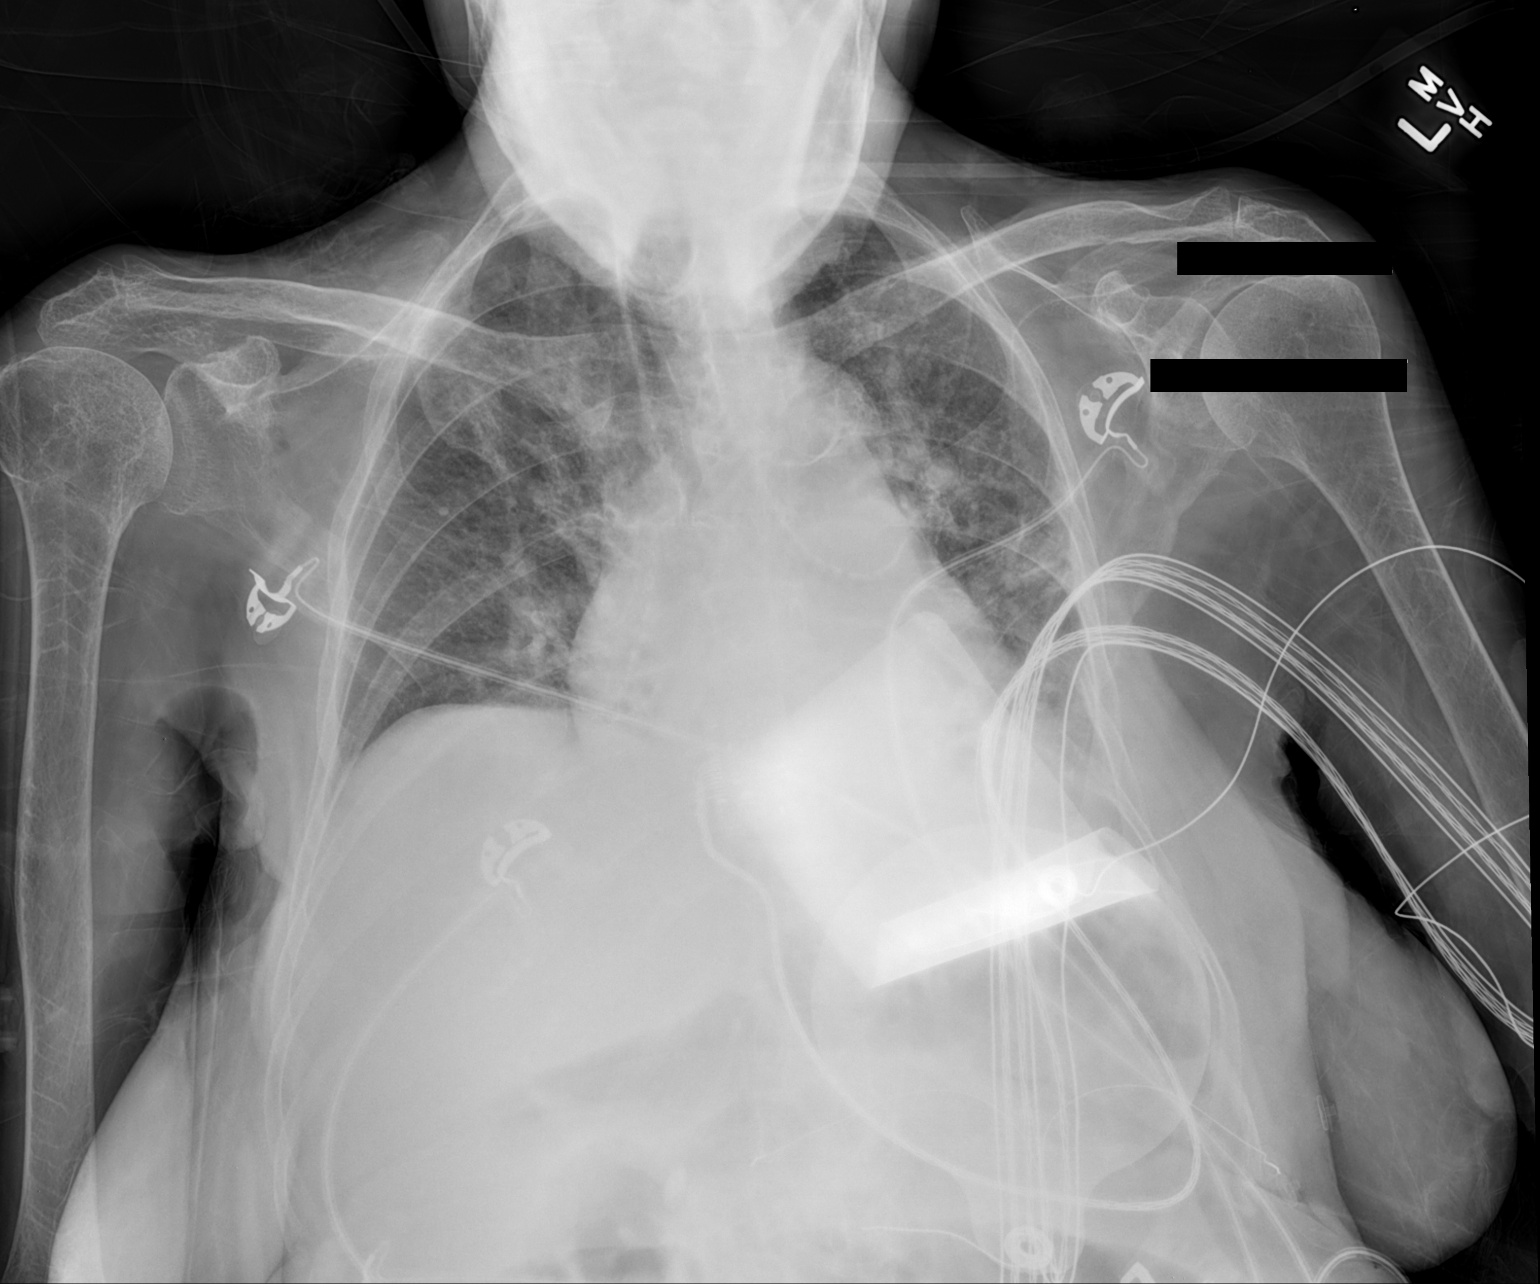

[1 of 1 positions shown; findings below may reference images not displayed]

FINDINGS: Cardiomegaly with pulmonary vascular congestion and
suspected mild interstitial edema.

Mild patchy left basilar opacity, atelectasis versus pneumonia.

Underlying chronic interstitial markings.

No pneumothorax.

Left lung base is obscured by defibrillator pads.
IMPRESSION: Cardiomegaly with pulmonary vascular congestion and suspected mild
interstitial edema.

Mild patchy left basilar opacity, atelectasis versus pneumonia.

## 2014-03-15 IMAGING — DX DG CHEST 1V PORT
1 series · 1 of 1 positions shown · non-contrast
Comparison: 02/02/2013 and 01/15/2010

CLINICAL DATA: Pulmonary infiltrates.

PORTABLE CHEST - 1 VIEW

[AP]
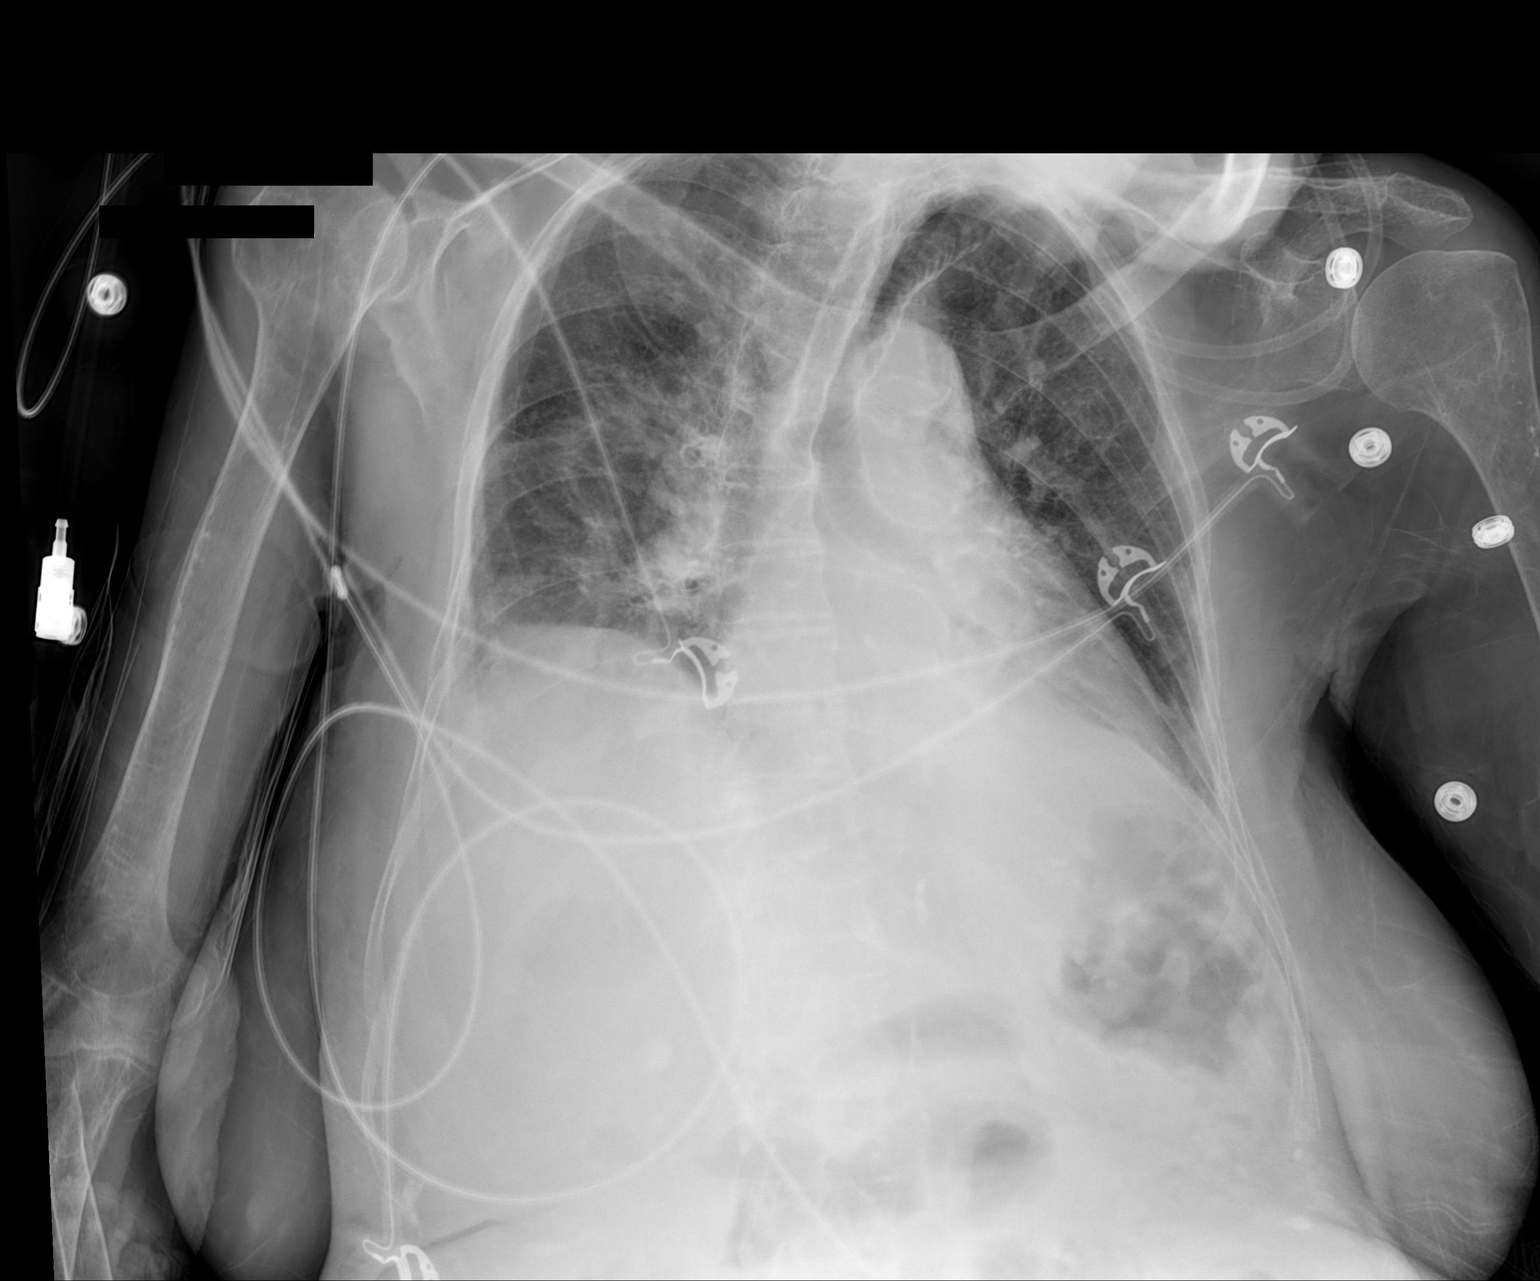

[1 of 1 positions shown; findings below may reference images not displayed]

FINDINGS: The heart size and pulmonary vascularity are normal.
There is minimal atelectasis in the right mid and lower lung zone
and at the left base medially.  No consolidative infiltrates or
effusions.  No acute osseous abnormality.
IMPRESSION: Slight atelectasis.

## 2014-03-15 IMAGING — CT CT HEAD W/O CM
1 series · 16 of 30 positions shown, 20 images · non-contrast
Comparison: 01/10/2010

CLINICAL DATA: Left arm weakness.  Encephalopathy.

CT HEAD WITHOUT CONTRAST
TECHNIQUE: Contiguous axial images were obtained from the base of
the skull through the vertex without contrast.

[Series 2: headseq 4.8 h45s · axial · 0.47mm/px · z∈[-114,+14]mm · 16 of 30 slices shown, 20 images]
[im 2/30  brain]
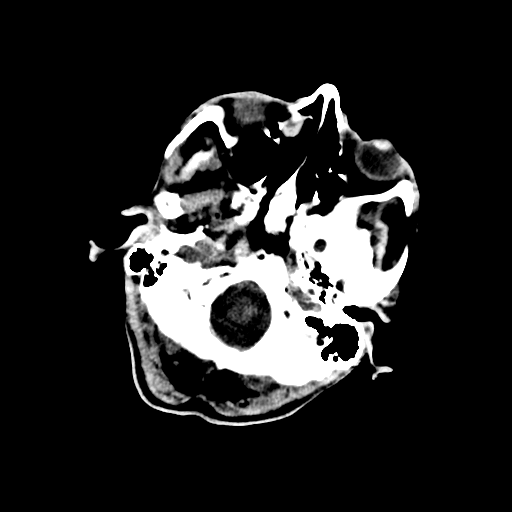
[im 2/30  bone]
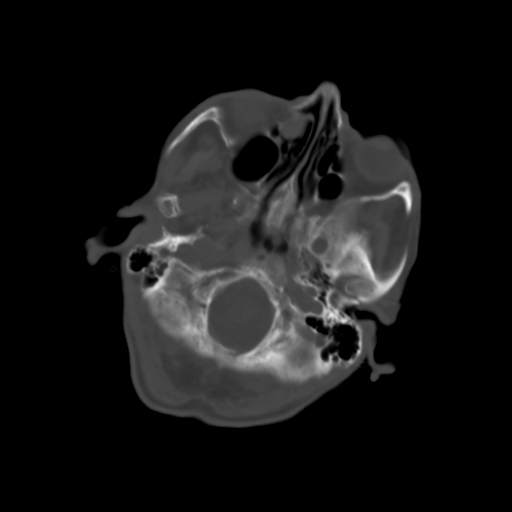
[im 4/30  brain]
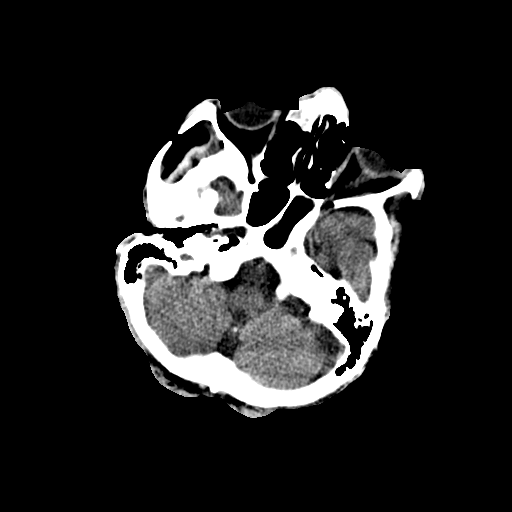
[im 6/30  brain]
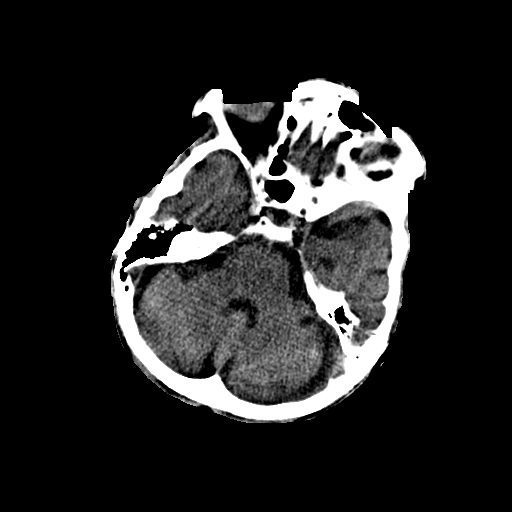
[im 8/30  brain]
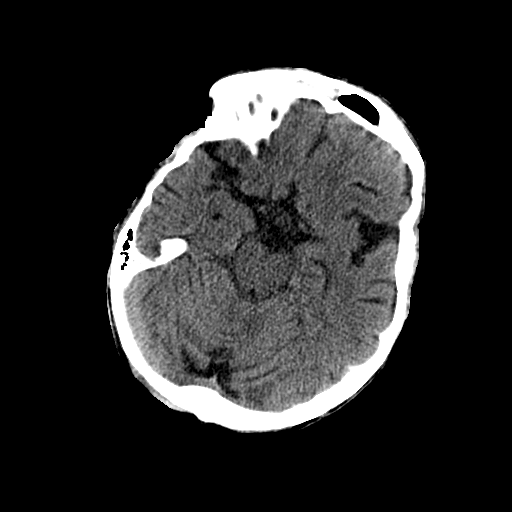
[im 9/30  brain]
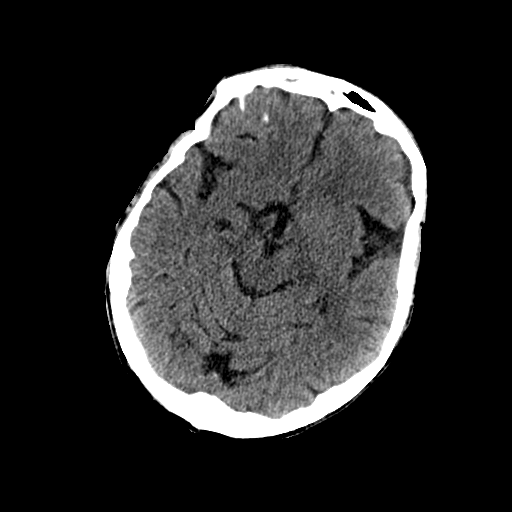
[im 9/30  bone]
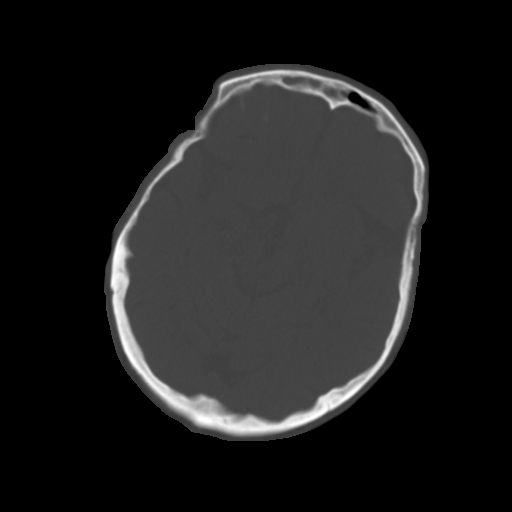
[im 11/30  brain]
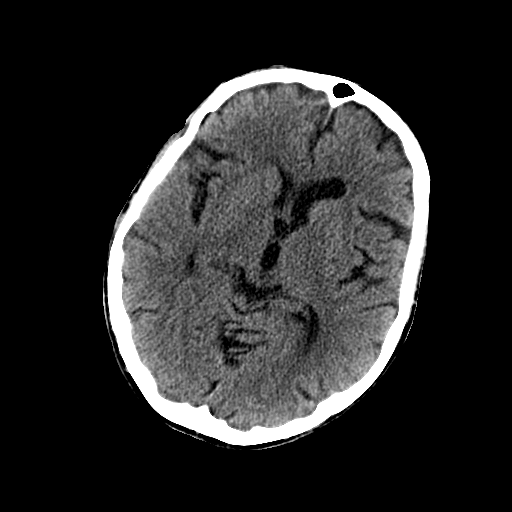
[im 13/30  brain]
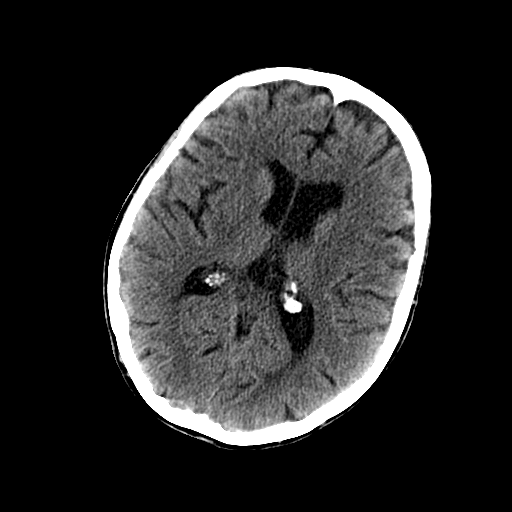
[im 15/30  brain]
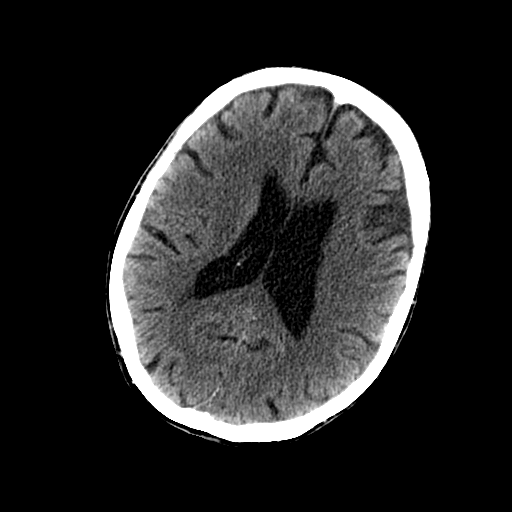
[im 16/30  brain]
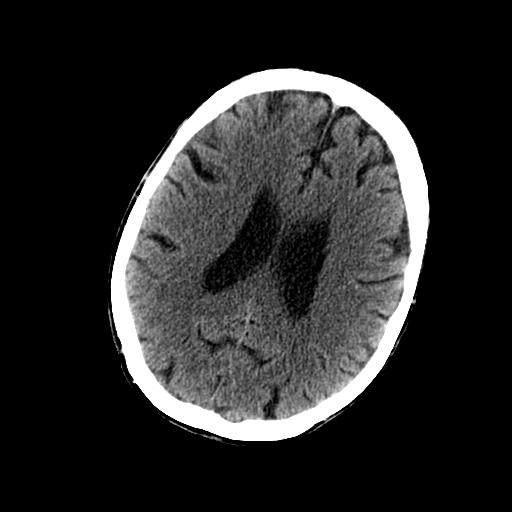
[im 16/30  bone]
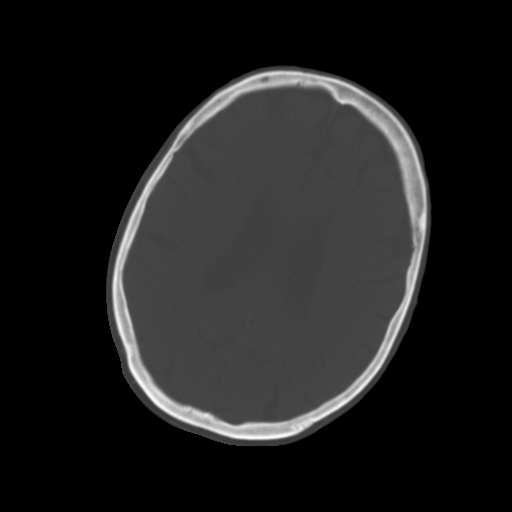
[im 18/30  brain]
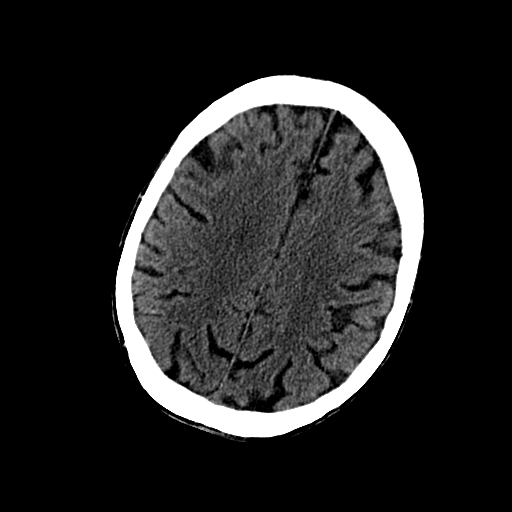
[im 20/30  brain]
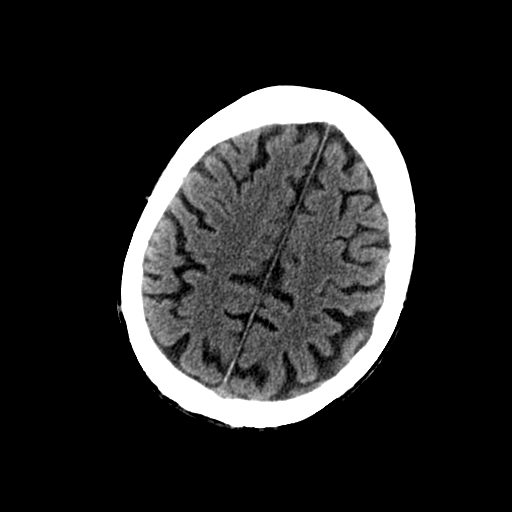
[im 22/30  brain]
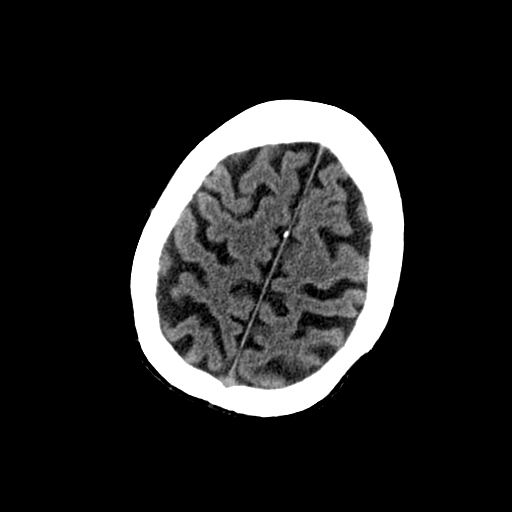
[im 23/30  brain]
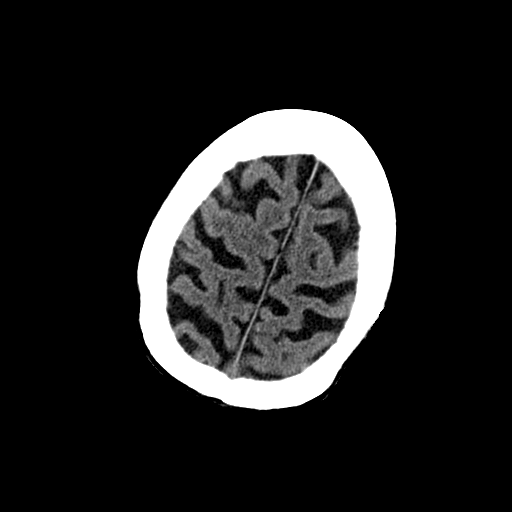
[im 23/30  bone]
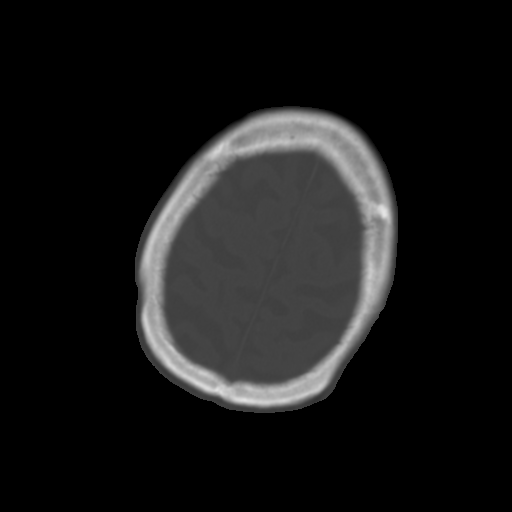
[im 25/30  brain]
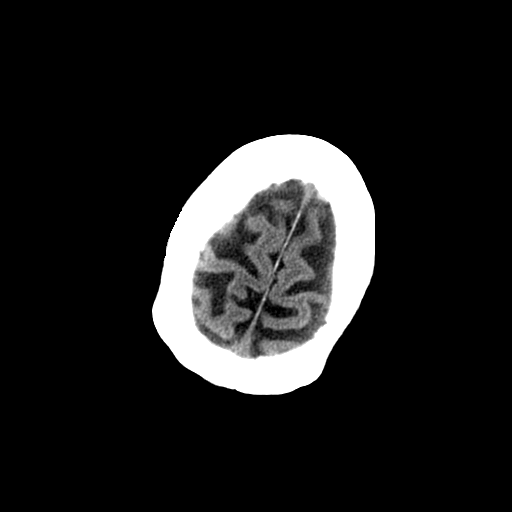
[im 27/30  brain]
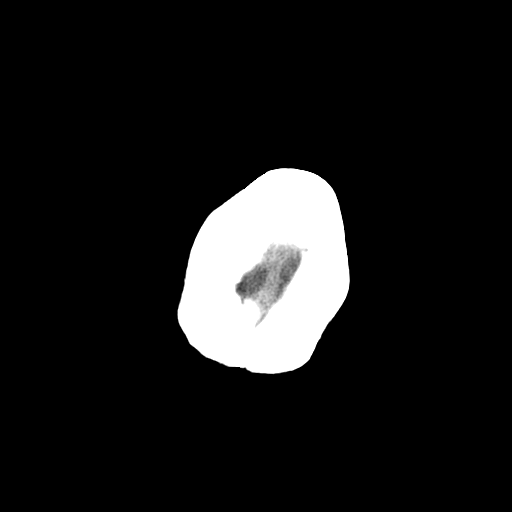
[im 29/30  brain]
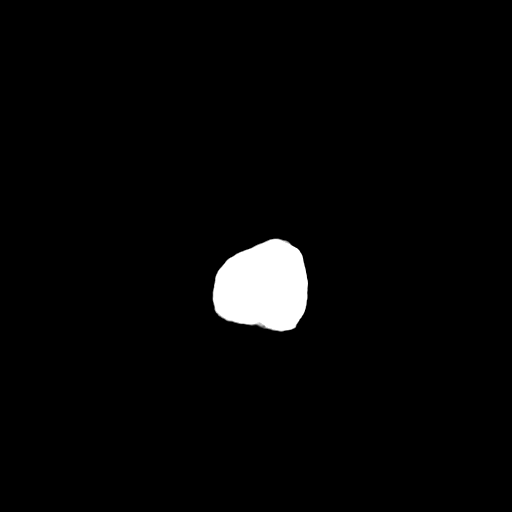

[16 of 30 positions shown; findings below may reference images not displayed]

FINDINGS: There is no acute intracranial hemorrhage, infarction, or
mass lesion.  There is diffuse cerebral cortical and cerebellar
atrophy.  Probable congenital asymmetry of the lateral ventricles,
unchanged.  Chronic mucosal thickening in the paranasal sinuses,
stable.
IMPRESSION: No acute abnormality.  Diffuse atrophy.

## 2014-06-02 ENCOUNTER — Non-Acute Institutional Stay (SKILLED_NURSING_FACILITY): Payer: Medicare Other | Admitting: Internal Medicine

## 2014-06-02 DIAGNOSIS — N179 Acute kidney failure, unspecified: Secondary | ICD-10-CM

## 2014-06-02 DIAGNOSIS — R627 Adult failure to thrive: Secondary | ICD-10-CM

## 2014-06-02 DIAGNOSIS — J9601 Acute respiratory failure with hypoxia: Secondary | ICD-10-CM

## 2014-06-02 DIAGNOSIS — R1314 Dysphagia, pharyngoesophageal phase: Secondary | ICD-10-CM

## 2014-06-02 DIAGNOSIS — I5032 Chronic diastolic (congestive) heart failure: Secondary | ICD-10-CM

## 2014-06-02 DIAGNOSIS — G510 Bell's palsy: Secondary | ICD-10-CM

## 2014-06-02 DIAGNOSIS — D649 Anemia, unspecified: Secondary | ICD-10-CM

## 2014-06-02 DIAGNOSIS — J9600 Acute respiratory failure, unspecified whether with hypoxia or hypercapnia: Secondary | ICD-10-CM

## 2014-06-02 NOTE — Progress Notes (Signed)
MRN: [id number] Name: Carrie [name]  Sex: female Age: 80 y.o. DOB: [birth year]  PSC #: Carrie [name] Facility/Room: 213B [id number] Of Care: SNF Provider: Merrilee [name], Carrie [name] Emergency Contacts: Extended Emergency Contact Information Primary [facility] Contact: [name] Address: [address]          [place], [place] [address] Carrie [name] Home Phone: [phone] Relation: Sister  Code Status: DNR - Hospice  Allergies: Review of patient's allergies indicates no known allergies.  Chief Complaint  Patient presents with  . Medical Management of Chronic Issues    HPI: Patient is 80 y.o. female who is a hospice pt who is being seen for routine comfort issues.  Past Medical History  Diagnosis Date  . CHF (congestive heart failure)   . 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 Esophageal reflux   . Peripheral vascular disease   . Altered mental status   . Osteoarthrosis   . Hypertension   . Depressive disorder     History reviewed. No pertinent past surgical history.    Medication List       This list is accurate as of: 06/02/14 11:59 PM.  Always use your most recent med list.               acetaminophen 325 MG tablet  Commonly known as:  TYLENOL  Take 2 tablets (650 mg total) by mouth every 6 (six) hours as needed for pain or fever.     aspirin 81 MG tablet  Take 81 mg by mouth daily.     atropine 1 % ophthalmic solution  2 DROPS SUBLINGUAL EVERY 4 HOURS AS NEEDED FOR SECRETIONS.     bisacodyl 10 MG suppository  Commonly known as:  DULCOLAX  Place 1 suppository (10 mg total) rectally daily as needed.     docusate sodium 100 MG capsule  Commonly known as:  COLACE  Take 100 mg by mouth 2 (two) times daily.     escitalopram 10 MG tablet  Commonly known as:  LEXAPRO  Take 10 mg by mouth daily.     furosemide 20 MG tablet  Commonly known as:  LASIX  Take 1 tablet (20 mg total) by mouth daily.     gabapentin 600 MG tablet  Commonly known  as:  NEURONTIN  Take 600 mg by mouth 3 (three) times daily.     LORazepam 0.5 MG tablet  Commonly known as:  ATIVAN  Take 1 tablet (0.5 mg total) by mouth every 6 (six) hours as needed for anxiety.     morphine 20 MG/ML concentrated solution  Commonly known as:  ROXANOL  Take 0.7125ml by mouth every 3 hours as needed for pain     potassium chloride 10 MEQ tablet  Commonly known as:  K-DUR  Take 10 mEq by mouth daily.     ranitidine 150 MG tablet  Commonly known as:  ZANTAC  Take 150 mg by mouth at bedtime.     RESOURCE THICKENUP CLEAR Powd  uSE FOR DIET WITH NECTAR THICK LIQUIDS     SYSTANE 0.4-0.3 % Gel  Generic drug:  Polyethyl Glycol-Propyl Glycol  Apply 1 drop to eye 2 (two) times daily.     tamsulosin 0.4 MG Caps capsule  Commonly known as:  FLOMAX  Take 0.4 mg by mouth daily.        No orders of the defined types were placed in this encounter.    Immunization History  Administered Date(s) Administered  . Pneumococcal-Unspecified 09/13/2009  History  Substance Use Topics  . Smoking status: Former Games developermoker  . Smokeless tobacco: Not on file  . Alcohol Use: Not on file    Review of Systems  DATA OBTAINED: from patient; on c/o pain R eye GENERAL: Feels well   SKIN: No itching, rash HEENT: as above RESPIRATORY: No cough, wheezing, SOB CARDIAC: No chest pain, palpitations, lower extremity edema  GI: No abdominal pain, No N/V/[name] or constipation, No heartburn or reflux  GU: No dysuria, frequency or urgency, or incontinence  MUSCULOSKELETAL: No unrelieved bone/joint pain NEUROLOGIC: No headache, dizziness  PSYCHIATRIC: No overt anxiety or sadness. Sleeps well.   Filed Vitals:   06/02/14 2311  BP: 101/62  Pulse: 62  Temp: 97.9 F (36.6 C)  Resp: 18    Physical Exam  GENERAL APPEARANCE: Alert, conversant. Appropriately groomed. No acute distress  SKIN: No diaphoresis rash HEENT: Unremarkable RESPIRATORY: Breathing is even, unlabored. Lung sounds are  diffusely decreased CARDIOVASCULAR: Heart RRR no murmurs, rubs or gallops. No peripheral edema  GASTROINTESTINAL: Abdomen is soft, non-tender, mild distended w/ normal bowel sounds.  GENITOURINARY: Bladder non tender, not distended  MUSCULOSKELETAL: No abnormal joints or musculature NEUROLOGIC: R facial weakness with failure of closure R eye lids PSYCHIATRIC: Mood and affect appropriate to situation, no behavioral issues  Patient Active Problem List   Diagnosis Date Noted  . Bell's palsy 07/22/2013  . Conjunctivitis 07/22/2013  . Acute ear infection 07/21/2013  . Failure to thrive syndrome, adult 07/17/2013  . Chronic diastolic CHF (congestive heart failure), NYHA class 4 07/17/2013  . Urine retention 03/25/2013  . Dysphagia, pharyngoesophageal phase 02/10/2013  . Unspecified constipation 02/09/2013  . Pain, generalized 02/09/2013  . Anxiety state, unspecified 02/09/2013  . UTI (urinary tract infection) 02/05/2013  . Hypokalemia 02/05/2013  . Acute on chronic diastolic CHF (congestive heart failure) 02/04/2013  . Ascites 02/04/2013  . Generalized weakness 02/04/2013  . Acute diastolic CHF (congestive heart failure) 02/03/2013  . Transaminasemia 02/03/2013  . Acute respiratory failure with hypoxia 02/02/2013  . UTI (lower urinary tract infection) 02/02/2013  . Leukocytosis 02/02/2013  . Hyperkalemia 02/02/2013  . Anemia 02/02/2013  . Coagulopathy 02/02/2013  . Acute kidney failure 02/02/2013  . Dementia 02/02/2013       Assessment and Plan  Chronic diastolic CHF (congestive heart failure), NYHA class 4 Pt has been DNR/hospice for at least a year, expected to die and hasn't. Has been doing well bedbound  Acute respiratory failure with hypoxia Pt has had no acute problems  Dysphagia, pharyngoesophageal phase Pt has had no problems with her chronic aspiration  Bell's palsy Pt did not regain movement of her R face.Pt refuses to let R eye be taped even though  She c/o  some irritation  Anemia Pt is do not draw labs;stable with BP and mentation  Failure to thrive syndrome, adult Continues but pt is stable and by all appearances content    Margit HanksALEXANDER, Carrie D, MD

## 2014-06-06 ENCOUNTER — Encounter: Payer: Self-pay | Admitting: Internal Medicine

## 2014-06-06 NOTE — Assessment & Plan Note (Signed)
Pt has been DNR/hospice for at least a year, expected to die and hasn't. Has been doing well bedbound

## 2014-06-06 NOTE — Assessment & Plan Note (Signed)
Pt is do not draw labs;stable with BP and mentation

## 2014-06-06 NOTE — Assessment & Plan Note (Deleted)
Pt is do not draw labs

## 2014-06-06 NOTE — Assessment & Plan Note (Signed)
Pt did not regain movement of her R face.Pt refuses to let R eye be taped even though  She c/o some irritation

## 2014-06-06 NOTE — Assessment & Plan Note (Signed)
Pt has had no acute problems

## 2014-06-06 NOTE — Assessment & Plan Note (Signed)
Pt has had no problems with her chronic aspiration

## 2014-06-06 NOTE — Assessment & Plan Note (Signed)
Continues but pt is stable and by all appearances content

## 2014-08-03 ENCOUNTER — Encounter: Payer: Self-pay | Admitting: Internal Medicine

## 2014-09-13 ENCOUNTER — Encounter: Payer: Self-pay | Admitting: Internal Medicine

## 2014-09-16 ENCOUNTER — Encounter: Payer: Self-pay | Admitting: Internal Medicine

## 2014-09-16 ENCOUNTER — Non-Acute Institutional Stay (SKILLED_NURSING_FACILITY): Payer: Medicare Other | Admitting: Internal Medicine

## 2014-09-16 DIAGNOSIS — F0390 Unspecified dementia without behavioral disturbance: Secondary | ICD-10-CM

## 2014-09-16 DIAGNOSIS — K5909 Other constipation: Secondary | ICD-10-CM

## 2014-09-16 DIAGNOSIS — G510 Bell's palsy: Secondary | ICD-10-CM

## 2014-09-16 DIAGNOSIS — I5032 Chronic diastolic (congestive) heart failure: Secondary | ICD-10-CM

## 2014-09-16 DIAGNOSIS — R627 Adult failure to thrive: Secondary | ICD-10-CM

## 2014-09-16 DIAGNOSIS — K5900 Constipation, unspecified: Secondary | ICD-10-CM

## 2014-09-16 NOTE — Progress Notes (Signed)
Patient ID: Carrie [name], female   DOB: [birth year], 80 y.o.   MRN: [id number]  [facility] Provider:  Gwenith Spitziffany L. Renato Gailseed, D.O., C.M.D.  Code Status:  DNR, on hospice care  Chief Complaint  Patient presents with  . Medical Management of Chronic Issues    hospice care    HPI:  80 yo female with end stage chf, COPD, dementia, failure to thrive on hospice care.  She continues to lose weight.  She's been bedbound for months.  She now weighs 69 lbs.  She has dysphagia with a modified diet and thickened liquids.  She c/o her bottom aching and continues to be bothered by her bell's palsy preventing her right eye from closing--patch being used.  Remains very pleasant and conversive despite her cachectic frail state.  Review of Systems:  Review of Systems  Constitutional: Positive for weight loss and malaise/fatigue. Negative for fever.  HENT: Negative for congestion.   Eyes: Positive for photophobia and pain.  Respiratory: Negative for shortness of breath.   Cardiovascular: Negative for chest pain and leg swelling.  Gastrointestinal: Negative for abdominal pain and constipation.  Genitourinary: Negative for dysuria.  Musculoskeletal: Negative for falls.  Neurological: Positive for weakness. Negative for dizziness and loss of consciousness.  Endo/Heme/Allergies: Does not bruise/bleed easily.  Psychiatric/Behavioral: Positive for memory loss.    Medications: Patient's Medications  New Prescriptions   No medications on file  Previous Medications   ATROPINE 1 % OPHTHALMIC SOLUTION    2 DROPS SUBLINGUAL EVERY 4 HOURS AS NEEDED FOR SECRETIONS.   BISACODYL (DULCOLAX) 10 MG SUPPOSITORY    Place 1 suppository (10 mg total) rectally daily as needed.   MALTODEXTRIN-XANTHAN GUM (RESOURCE THICKENUP CLEAR) POWD    uSE FOR DIET WITH NECTAR THICK LIQUIDS   MORPHINE (ROXANOL) 20 MG/ML CONCENTRATED SOLUTION    Take 0.125ml by mouth every 3 hours as needed for pain   POLYETHYL  GLYCOL-PROPYL GLYCOL (SYSTANE) 0.4-0.3 % GEL    Apply 1 drop to eye 2 (two) times daily.  Modified Medications   No medications on file  Discontinued Medications   ACETAMINOPHEN (TYLENOL) 325 MG TABLET    Take 2 tablets (650 mg total) by mouth every 6 (six) hours as needed for pain or fever.   ASPIRIN 81 MG TABLET    Take 81 mg by mouth daily.   DOCUSATE SODIUM (COLACE) 100 MG CAPSULE    Take 100 mg by mouth 2 (two) times daily.   ESCITALOPRAM (LEXAPRO) 10 MG TABLET    Take 10 mg by mouth daily.   FUROSEMIDE (LASIX) 20 MG TABLET    Take 1 tablet (20 mg total) by mouth daily.   GABAPENTIN (NEURONTIN) 600 MG TABLET    Take 600 mg by mouth 3 (three) times daily.   LORAZEPAM (ATIVAN) 0.5 MG TABLET    Take 1 tablet (0.5 mg total) by mouth every 6 (six) hours as needed for anxiety.   POTASSIUM CHLORIDE (K-DUR) 10 MEQ TABLET    Take 10 mEq by mouth daily.   RANITIDINE (ZANTAC) 150 MG TABLET    Take 150 mg by mouth at bedtime.   TAMSULOSIN (FLOMAX) 0.4 MG CAPS    Take 0.4 mg by mouth daily.    Physical Exam: Filed Vitals:   09/16/14 1142  BP: 105/68  Pulse: 72  Temp: 96.9 F (36.1 C)  Resp: 18  Height: 4\' 9"  (1.448 m)  Weight: 69 lb (31.298 kg)  SpO2: 97%  Physical Exam  Constitutional:  Cachectic female, nad  HENT:  Head: Normocephalic and atraumatic.  Cardiovascular: Normal rate, regular rhythm, normal heart sounds and intact distal pulses.   Pulmonary/Chest: She has wheezes.  Increased effort  Abdominal: Soft. Bowel sounds are normal. She exhibits no distension and no mass. There is no tenderness.  Neurological: She is alert.     Assessment/Plan 1. Chronic diastolic CHF (congestive heart failure), NYHA class 4 -on hospice care for this -continues with oxygen -continues to lose weight   2. Chronic constipation -stable with current regimen  3. Dementia, without behavioral disturbance -was quite alert today  4. Bell's palsy -cont right eye patch, ointment  5. Failure to  thrive syndrome, adult -cont hospice care  Family/ staff Communication: discussed with her nurse  Goals of care: DNR, hospice care, comfort measure, do not hospitalize

## 2014-10-29 ENCOUNTER — Non-Acute Institutional Stay (SKILLED_NURSING_FACILITY): Payer: Medicare Other | Admitting: Adult Health

## 2014-10-29 DIAGNOSIS — I5032 Chronic diastolic (congestive) heart failure: Secondary | ICD-10-CM

## 2014-10-29 DIAGNOSIS — H5711 Ocular pain, right eye: Secondary | ICD-10-CM

## 2014-10-29 DIAGNOSIS — F0390 Unspecified dementia without behavioral disturbance: Secondary | ICD-10-CM

## 2014-10-29 DIAGNOSIS — R1314 Dysphagia, pharyngoesophageal phase: Secondary | ICD-10-CM

## 2014-10-31 ENCOUNTER — Encounter: Payer: Self-pay | Admitting: Adult Health

## 2014-10-31 DIAGNOSIS — H5711 Ocular pain, right eye: Secondary | ICD-10-CM | POA: Insufficient documentation

## 2014-10-31 NOTE — Progress Notes (Signed)
Patient ID: Carrie [name], female   DOB: Sep 02, 1925, 80 y.o.   MRN: [id number]  [facility]     No Known Allergies     Chief Complaint  Patient presents with  . Medical Management of Chronic Issues    HPI:  She is a long term resident of this facility being seen for the management of her chronic illnesses. She is being followed by hospice care. There are no nursing concerns being voiced at this time. She is complaining of right eye pain. She does wear a patch for her eye pain. She states that she has had the pain for a long time and the pain is no worse than normal. She does not want to take any further medications at this time.    Past Medical History  Diagnosis Date  . CHF (congestive heart failure)   . 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 Esophageal reflux   . Peripheral vascular disease   . Altered mental status   . Osteoarthrosis   . Hypertension   . Depressive disorder     No past surgical history on file.  VITAL SIGNS BP 110/58 mmHg  Pulse 68  Ht 4\' 9"  (1.448 m)  Wt 69 lb (31.298 kg)  BMI 14.93 kg/m2   Outpatient Encounter Prescriptions as of 10/29/2014  Medication Sig  . atropine 1 % ophthalmic solution 2 DROPS SUBLINGUAL EVERY 4 HOURS AS NEEDED FOR SECRETIONS.  Marland Kitchen. bisacodyl (DULCOLAX) 10 MG suppository Place 1 suppository (10 mg total) rectally daily as needed.  . Maltodextrin-Xanthan Gum (RESOURCE THICKENUP CLEAR) POWD uSE FOR DIET WITH NECTAR THICK LIQUIDS  . morphine (ROXANOL) 20 MG/ML concentrated solution Take 0.3525ml by mouth every 3 hours as needed for pain  . Polyethyl Glycol-Propyl Glycol (SYSTANE) 0.4-0.3 % GEL Apply 1 drop to eye 2 (two) times daily.       SIGNIFICANT DIAGNOSTIC EXAMS   02-10-13: wbc 7.5; hgb 9.0; hct 331.; mcv 82.5; plt 217; glucose 72; bun 9; creat 0.48; k+ 3.7 Na++ 143; liver normal albumin 2.4; tsh 3.618    Review of Systems  Constitutional: Negative for malaise/fatigue.  Eyes:       Has right eye has had for a  long time   Respiratory: Negative for cough and shortness of breath.   Cardiovascular: Negative for chest pain.  Gastrointestinal: Negative for heartburn, abdominal pain and constipation.  Musculoskeletal: Negative for myalgias and joint pain.  Skin: Negative.   Psychiatric/Behavioral: The patient is not nervous/anxious.      Physical Exam  Constitutional: No distress.  cathectic   Eyes:  Wears patch on right eye   Cardiovascular: Normal rate and regular rhythm.   Respiratory: Effort normal and breath sounds normal. No respiratory distress.  GI: Soft. Bowel sounds are normal. She exhibits no distension.  Musculoskeletal: She exhibits no edema.  Moves upper extremities; spends all of time in bed   Skin: Skin is warm and dry. She is not diaphoretic.       ASSESSMENT/ PLAN:  1. Diastolic heart failure: she is presently not taking any medications at this time. The focus of her care is comfort only. She has roxanol 5 mg every 3 hours as needed for pain and distress. Will monitor   2. Dysphagia: no signs of aspiration present will continue nectar thick liquids. Will monitor   3. Dementia: no significant change in her status; she does continue to lose weight. She is presently not taking medications. Will monitor  4. FTT: the focus of  her care is comfort; she is followed by hospice care. Her present weight is 69 pounds. She has atarax 10 mg every 6 hours as needed for itching; atropine drops for increased oral secretions  5. Right eye pain: will continue her eye drops and ointment and eye patch and will monitor     Synthia Innocenteborah Green NP [facility]  Contact [phone] Monday through Friday 8am- 5pm  After hours call [phone]

## 2014-11-26 ENCOUNTER — Non-Acute Institutional Stay (SKILLED_NURSING_FACILITY): Payer: Medicare Other | Admitting: Internal Medicine

## 2014-11-26 DIAGNOSIS — L02831 Carbuncle of head [any part, except face]: Secondary | ICD-10-CM

## 2014-11-29 ENCOUNTER — Encounter: Payer: Self-pay | Admitting: Internal Medicine

## 2014-11-29 MED ORDER — HYDROXYZINE HCL 10 MG PO TABS: 10.0000 mg | ORAL_TABLET | Freq: Four times a day (QID) | ORAL | Status: AC | PRN

## 2014-11-29 NOTE — Progress Notes (Signed)
Patient ID: Carrie [name], female   DOB: 04-26-1925, 81 y.o.   MRN: [id number]    [name]  PCP: Carrie Kirschner DO FACOI  Code Status: DNR  No Known Allergies  Chief Complaint  Patient presents with  . Sore    on scalp     HPI:  81 yo female seen today for an acute visit for above. Hospice nurse noticed lump on her scalp. There is concern that she has a pressure sore as she lies supine most of the time. Her sister is present. Pt has no c/o of pain but has noticed itching.  Review of Systems:  As above.   Past Medical History  Diagnosis Date  . CHF (congestive heart failure)   . 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 Esophageal reflux   . Peripheral vascular disease   . Altered mental status   . Osteoarthrosis   . Hypertension   . Depressive disorder    No past surgical history on file. Social History:   reports that she has quit smoking. She does not have any smokeless tobacco history on file. Her alcohol and drug histories are not on file.  No family history on file.  Medications: Patient's Medications  New Prescriptions   HYDROXYZINE (ATARAX/VISTARIL) 10 MG TABLET    Take 1 tablet (10 mg total) by mouth every 6 (six) hours as needed for itching.  Previous Medications   ATROPINE 1 % OPHTHALMIC SOLUTION    2 DROPS SUBLINGUAL EVERY 4 HOURS AS NEEDED FOR SECRETIONS.   BISACODYL (DULCOLAX) 10 MG SUPPOSITORY    Place 1 suppository (10 mg total) rectally daily as needed.   MALTODEXTRIN-XANTHAN GUM (RESOURCE THICKENUP CLEAR) POWD    uSE FOR DIET WITH NECTAR THICK LIQUIDS   MORPHINE (ROXANOL) 20 MG/ML CONCENTRATED SOLUTION    Take 0.55ml by mouth every 3 hours as needed for pain   POLYETHYL GLYCOL-PROPYL GLYCOL (SYSTANE) 0.4-0.3 % GEL    Apply 1 drop to eye 2 (two) times daily.  Modified Medications   No medications on file  Discontinued Medications   No medications on file     Physical Exam: CONSTITUTIONAL: Looks well in NAD. Awake and alert  HEENT: scalp with egg sized carbuncle, TTP with minimum d/c. No increased warmth to touch  Filed Vitals:   11/26/14 1133  BP: 110/58  Pulse: 68  Temp: 97 F (36.1 C)  SpO2: 95%     Labs reviewed: No recent labs   Assessment/Plan    ICD-9-CM ICD-10-CM   1. Carbuncle of head 680.8 L02.831    - due to her frail state, will cover for MRSA. Rx Bactrim DS BID x 10 days.   - wound care consult  - continue all other medications as rx  Family/ staff Communication: discussed with Hospice nurse and patient.     Labs/tests ordered   [employer]. Ancil Linsey  [facility] [address] [place], [place] [address] [phone] Office (Wednesdays and Fridays 8 AM - 5 PM) [phone] Cell (Monday-Friday 8 AM - 5 PM)

## 2014-12-04 ENCOUNTER — Non-Acute Institutional Stay (SKILLED_NURSING_FACILITY): Payer: Medicare Other | Admitting: Adult Health

## 2014-12-04 DIAGNOSIS — H5711 Ocular pain, right eye: Secondary | ICD-10-CM

## 2014-12-04 DIAGNOSIS — J9601 Acute respiratory failure with hypoxia: Secondary | ICD-10-CM

## 2014-12-04 DIAGNOSIS — R627 Adult failure to thrive: Secondary | ICD-10-CM

## 2014-12-04 DIAGNOSIS — I5032 Chronic diastolic (congestive) heart failure: Secondary | ICD-10-CM

## 2014-12-04 DIAGNOSIS — R1314 Dysphagia, pharyngoesophageal phase: Secondary | ICD-10-CM

## 2014-12-04 DIAGNOSIS — F0390 Unspecified dementia without behavioral disturbance: Secondary | ICD-10-CM

## 2014-12-25 ENCOUNTER — Encounter: Payer: Self-pay | Admitting: Adult Health

## 2014-12-25 DIAGNOSIS — H5711 Ocular pain, right eye: Secondary | ICD-10-CM | POA: Insufficient documentation

## 2014-12-25 NOTE — Progress Notes (Signed)
Patient ID: Carrie [name], female   DOB: Apr 29, 1925, 81 y.o.   MRN: [id number]  [facility]     No Known Allergies     Chief Complaint  Patient presents with  . Medical Management of Chronic Issues    HPI:  She is a long term resident of this facility being seen for the management of her chronic illnesses. She is followed by hospice care. She is slowly declining. She continues to have chronic right eye pain for which she tells me that she is getting adequate pain relief.  There are no nursing concerns being voiced at this time    Past Medical History  Diagnosis Date  . CHF (congestive heart failure)   . 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 Esophageal reflux   . Peripheral vascular disease   . Altered mental status   . Osteoarthrosis   . Hypertension   . Depressive disorder     No past surgical history on file.  VITAL SIGNS BP 110/58 mmHg  Pulse 68  Ht  (1.448 m)  Wt 69 lb (31.298 kg)  BMI 14.93 kg/m2   Outpatient Encounter Prescriptions as of 12/04/2014  Medication Sig  . atropine 1 % ophthalmic solution 2 DROPS SUBLINGUAL EVERY 4 HOURS AS NEEDED FOR SECRETIONS.  Marland Kitchen bisacodyl (DULCOLAX) 10 MG suppository Place 1 suppository (10 mg total) rectally daily as needed.  . hydrOXYzine (ATARAX/VISTARIL) 10 MG tablet Take 1 tablet (10 mg total) by mouth every 6 (six) hours as needed for itching.  . Maltodextrin-Xanthan Gum (RESOURCE THICKENUP CLEAR) POWD uSE FOR DIET WITH NECTAR THICK LIQUIDS  . morphine (ROXANOL) 20 MG/ML concentrated solution Take 0.29ml by mouth every 3 hours as needed for pain  . Polyethyl Glycol-Propyl Glycol (SYSTANE) 0.4-0.3 % GEL Apply 1 drop to eye 2 (two) times daily.     SIGNIFICANT DIAGNOSTIC EXAMS   LABS REVIEWED:   02-10-13: wbc 7.5; hgb 9.0; hct 331.; mcv 82.5; plt 217; glucose 72; bun 9; creat 0.48; k+ 3.7 Na++ 143; liver normal albumin 2.4; tsh 3.618     ROS Constitutional: Negative for malaise/fatigue.  Eyes:       Has  right eye has had for a long time   Respiratory: Negative for cough and shortness of breath.   Cardiovascular: Negative for chest pain.  Gastrointestinal: Negative for heartburn, abdominal pain and constipation.  Musculoskeletal: Negative for myalgias and joint pain.  Skin: Negative.   Psychiatric/Behavioral: The patient is not nervous/anxious.      Physical Exam  Constitutional: No distress.  cathectic   Eyes:  Wears patch on right eye   Cardiovascular: Normal rate and regular rhythm.   Respiratory: Effort normal and breath sounds normal. No respiratory distress.  GI: Soft. Bowel sounds are normal. She exhibits no distension.  Musculoskeletal: She exhibits no edema.  Moves upper extremities; spends all of time in bed   Skin: Skin is warm and dry. She is not diaphoretic.     ASSESSMENT/ PLAN:  1. Diastolic heart failure: she is presently not taking any medications at this time. The focus of her care is comfort only. She has roxanol 5 mg every 3 hours as needed for pain and distress. Will monitor   2. Dysphagia: no signs of aspiration present will continue nectar thick liquids. Will monitor   3. Dementia: no significant change in her status; she does continue to lose weight. She is presently not taking medications. Will monitor  4. FTT: the focus of her care is  comfort; she is followed by hospice care. Her last weight was  69 pounds. She has atarax 10 mg every 6 hours as needed for itching; atropine drops for increased oral secretions; her goal is comfort care only   5. Right eye pain: will continue her eye drops and ointment and eye patch and will monitor      Carrie Innocenteborah Lidia Clavijo NP [facility]  Contact [phone] Monday through Friday 8am- 5pm  After hours call [phone]

## 2015-01-26 DEATH — deceased
